# Patient Record
Sex: Male | Born: 1985 | Race: White | Hispanic: No | Marital: Single | Smoking: Never smoker
Health system: Southern US, Community
[De-identification: ages and names within clinical notes are randomized; demographics above are authoritative.]

## PROBLEM LIST (undated history)

## (undated) ENCOUNTER — Emergency Department (HOSPITAL_COMMUNITY): Payer: 59 | Source: Home / Self Care

## (undated) DIAGNOSIS — IMO0001 Reserved for inherently not codable concepts without codable children: Secondary | ICD-10-CM

## (undated) DIAGNOSIS — Z5189 Encounter for other specified aftercare: Secondary | ICD-10-CM

## (undated) DIAGNOSIS — Z8774 Personal history of (corrected) congenital malformations of heart and circulatory system: Secondary | ICD-10-CM

## (undated) DIAGNOSIS — R011 Cardiac murmur, unspecified: Secondary | ICD-10-CM

## (undated) DIAGNOSIS — Q2112 Patent foramen ovale: Secondary | ICD-10-CM

## (undated) DIAGNOSIS — K219 Gastro-esophageal reflux disease without esophagitis: Secondary | ICD-10-CM

## (undated) DIAGNOSIS — Z8489 Family history of other specified conditions: Secondary | ICD-10-CM

## (undated) DIAGNOSIS — J069 Acute upper respiratory infection, unspecified: Secondary | ICD-10-CM

## (undated) DIAGNOSIS — Q8719 Other congenital malformation syndromes predominantly associated with short stature: Secondary | ICD-10-CM

## (undated) DIAGNOSIS — Q211 Atrial septal defect: Secondary | ICD-10-CM

## (undated) HISTORY — PX: APPENDECTOMY: SHX54

## (undated) HISTORY — PX: TETRALOGY OF FALLOT REPAIR: SHX796

## (undated) HISTORY — PX: GASTROSTOMY TUBE PLACEMENT: SHX655

## (undated) HISTORY — PX: MECKEL DIVERTICULUM EXCISION: SHX314

## (undated) HISTORY — PX: JOINT REPLACEMENT: SHX530

## (undated) HISTORY — PX: HERNIA REPAIR: SHX51

## (undated) HISTORY — DX: Patent foramen ovale: Q21.12

## (undated) HISTORY — PX: OTHER SURGICAL HISTORY: SHX169

## (undated) HISTORY — PX: EYE SURGERY: SHX253

## (undated) HISTORY — DX: Atrial septal defect: Q21.1

---
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
## 2007-08-22 ENCOUNTER — Ambulatory Visit: Payer: Self-pay | Admitting: General Surgery

## 2008-11-21 ENCOUNTER — Ambulatory Visit (HOSPITAL_COMMUNITY): Admission: RE | Admit: 2008-11-21 | Discharge: 2008-11-21 | Payer: Self-pay | Admitting: Ophthalmology

## 2008-11-27 ENCOUNTER — Encounter (INDEPENDENT_AMBULATORY_CARE_PROVIDER_SITE_OTHER): Payer: Self-pay | Admitting: Ophthalmology

## 2008-11-27 ENCOUNTER — Ambulatory Visit: Payer: Self-pay | Admitting: Pediatrics

## 2008-11-27 ENCOUNTER — Inpatient Hospital Stay (HOSPITAL_COMMUNITY): Admission: RE | Admit: 2008-11-27 | Discharge: 2008-11-29 | Payer: Self-pay | Admitting: Ophthalmology

## 2009-02-10 ENCOUNTER — Ambulatory Visit (HOSPITAL_COMMUNITY): Admission: RE | Admit: 2009-02-10 | Discharge: 2009-02-10 | Payer: Self-pay | Admitting: Ophthalmology

## 2009-02-10 ENCOUNTER — Encounter (INDEPENDENT_AMBULATORY_CARE_PROVIDER_SITE_OTHER): Payer: Self-pay | Admitting: Ophthalmology

## 2009-04-14 ENCOUNTER — Ambulatory Visit: Payer: Self-pay | Admitting: General Surgery

## 2009-05-05 ENCOUNTER — Ambulatory Visit (HOSPITAL_COMMUNITY): Admission: RE | Admit: 2009-05-05 | Discharge: 2009-05-05 | Payer: Self-pay | Admitting: Urology

## 2009-09-12 ENCOUNTER — Ambulatory Visit (HOSPITAL_COMMUNITY): Admission: RE | Admit: 2009-09-12 | Discharge: 2009-09-12 | Payer: Self-pay | Admitting: Pediatric Dentistry

## 2010-02-09 ENCOUNTER — Ambulatory Visit: Payer: Self-pay | Admitting: General Surgery

## 2010-06-26 ENCOUNTER — Ambulatory Visit
Admission: RE | Admit: 2010-06-26 | Discharge: 2010-06-26 | Disposition: A | Discharge status: Home / Self Care | Payer: 59 | Source: Ambulatory Visit | Attending: Pediatrics | Admitting: Pediatrics

## 2010-06-26 ENCOUNTER — Other Ambulatory Visit: Payer: Self-pay | Admitting: Pediatrics

## 2010-06-26 DIAGNOSIS — R05 Cough: Secondary | ICD-10-CM

## 2010-06-29 LAB — POCT I-STAT 4, (NA,K, GLUC, HGB,HCT)
HCT: 42 % (ref 39.0–52.0)
Potassium: 4.2 mEq/L (ref 3.5–5.1)
Sodium: 139 mEq/L (ref 135–145)

## 2010-07-18 LAB — POCT I-STAT 4, (NA,K, GLUC, HGB,HCT)
Glucose, Bld: 116 mg/dL — ABNORMAL HIGH (ref 70–99)
HCT: 39 % (ref 39.0–52.0)
Potassium: 3.9 mEq/L (ref 3.5–5.1)

## 2010-08-25 NOTE — Op Note (Signed)
NAMEJAMARIS, THEARD                ACCOUNT NO.:  [id number]   MEDICAL RECORD NO.:  [id number]          PATIENT TYPE:  AMB   LOCATION:  SDS                          FACILITY:  [facility]   PHYSICIAN:  Pasty Spillers. Maple Hudson, M.D. DATE OF BIRTH:  March 24, 1986   DATE OF PROCEDURE:  11/04/2006  DATE OF DISCHARGE:                               OPERATIVE REPORT   PREOPERATIVE DIAGNOSES:  1. Cornelia de Lange syndrome.  2. Myopia.  3. 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   POSTOPERATIVE DIAGNOSES:  1. Cornelia de Lange syndrome.  2. Myopia.  3. 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   PROCEDURE:  Examination under anesthesia.   SURGEON:  Pasty Spillers. Maple Hudson, M.D.   ANESTHESIA:  General endotracheal.   DESCRIPTION OF PROCEDURE:  After preop evaluation and preoperative  pupillary dilation and informed consent, the patient was taken up the  room where he was identified by me.  General anesthesia was induced  without difficulty after placement of appropriate monitors.  As soon as  the patient was intubated, intraocular pressures were measured with a  Tono-Pen.  Intraocular pressures were 10 mmHg plus or minus 5% in the  right eye, and 11 mmHg plus or minus 5% in the left.  There was a large  white cataract in the right eye.  There is no fundus view in the right  eye.  The left cornea was found to have a large fibrovascular pannus  extending over almost the entire cornea, with only 1-2 mm crescent at  the temporal edge of the cornea unaffected.  This pannus prevented the  useful retinoscopic streak.  A good fundus view was, however, still  possible, and the fundus was flat 360 degrees with scleral depression.  The patient remained under anesthesia for dental work, which was  performed and is dictated separately by Tor Netters, D.D.S.      Pasty Spillers. Maple Hudson, M.D.  Electronically Signed     WOY/MEDQ  D:   11/04/2006  T:  11/05/2006  Job:  [id number]

## 2010-08-25 NOTE — Discharge Summary (Signed)
Justin Watson, Justin Watson                ACCOUNT NO.:  [id number]   MEDICAL RECORD NO.:  [id number]          PATIENT TYPE:  INP   LOCATION:  6114                         FACILITY:  [facility]   PHYSICIAN:  Dyann Ruddle, MDDATE OF BIRTH:  1985/12/15   DATE OF ADMISSION:  11/27/2008  DATE OF DISCHARGE:  11/29/2008                               DISCHARGE SUMMARY   REASON FOR HOSPITALIZATION:  Pain management, status post left eye  central reattachment surgery.   FINAL DIAGNOSIS:  Left eye retinal reattachment surgery, vitrectomies,  scleral buckle procedure.  Post-operative pain  Cornelia de Lange syndrome  Global developmental delay  Growth failure   BRIEF HOSPITAL COURSE:  Justin Watson is a 25 year old male with complicated  past medical history including cornelia de lange syndrome admitted for  pain control, status post left eye retinal reattachment surgery that was  performed on November 27, 2008, by Dr. Luciana Axe of Ophthalmology.  Initially, Justin Watson had a difficult postoperative course x24 hours with  significant agitation and pain.  He required IV morphine, Klonopin,  Ativan, and IV Toradol.  Over the following 24-48 hours, his pain and  agitation significantly decreased and he was transitioned p.o. via his G  tube medication and was discharged home in the care of his family.  He  was increasing his p.o. intake on discharge in the way of his formula  and Pedialyte.  He also had had a several wet diapers before his  discharge.  He has not had a bowel movement, but per the family, they  would prefer to get him an enema when he was home.   DISCHARGE WEIGHT:  15.8 kg.   DISCHARGE CONDITION:  Improved.   DISCHARGE DIET:  Resume regular diet.   DISCHARGE ACTIVITY:  Ad lib.   PROCEDURE:  Left eye retinal reattachment surgery, vitrectomy, scleral  buckle.   CONSULTANTS:  Ophthalmology Dr. Luciana Axe.   HOME MEDICATIONS:  1. Lactulose 1.3 g p.o. per G tube b.i.d.  2. Nexium 20 mg p.o. b.i.d.  3. Ibuprofen 160 mg p.o. per G tube t.i.d.  4. Zyrtec 5 mg p.o. per G tube 1 in the a.m.  5. MiraLax 1 capsule p.o. per G tube per day p.r.n. constipation.   NEW MEDICATIONS:  1. Klonopin 0.5 mg every 8 hours p.r.n. agitation.  2. Ibuprofen 160 mg q.6 p.r.n. pain.  3. Roxicodone 1.5 mg q.4-6 h. p.r.n. pain.  4. TobraDex ointment apply to the left eye 3 times a day.  5. Atropine ointment apply to the left eye b.i.d.  allow 15 minutes      between ointments.   PENDING RESULTS:  None.   FOLLOWUP ISSUES:  1. If agitation improves, return to Klonopin once daily at bedtime.  2. Keep soft patch and hard patch to left eye to protect.   FOLLOWUP APPOINTMENT:  With Dr. Luciana Axe Ophthalmology on December 04, 2008,  at 9:45 p.m., primary MD is Dr. Avis Epley at [facility].  We will fax the  discharge summary to her, no appointment is needed at this time.      Alvia Grove, DO  Electronically  Signed      Dyann Ruddle, MD  Electronically Signed    BB/MEDQ  D:  11/29/2008  T:  11/30/2008  Job:  [id number]

## 2010-08-25 NOTE — Op Note (Signed)
NAMESTAN, CANTAVE                ACCOUNT NO.:  [id number]   MEDICAL RECORD NO.:  [id number]          PATIENT TYPE:  AMB   LOCATION:  SDS                          FACILITY:  [facility]   PHYSICIAN:  Pasty Spillers. Maple Hudson, M.D. DATE OF BIRTH:  [birth year]   DATE OF PROCEDURE:  11/21/2008  DATE OF DISCHARGE:                               OPERATIVE REPORT   PREOPERATIVE DIAGNOSES:  1. High myopia.  2. History of retinal detachment, right eye.  3. Cornelia de Lange syndrome.   POSTOPERATIVE DIAGNOSIS:  1. Retinal detachment, left eye.  2. High myopia.  3. History of retinal detachment, right eye.  4. Cornelia de Lange syndrome.   PROCEDURE:  Examination under anesthesia, both eyes.   SURGEON:  Pasty Spillers. Young, MD   ANESTHESIA:  General (laryngeal mask).   COMPLICATIONS:  None.   DESCRIPTION OF PROCEDURE:  After preop evaluation including informed  consent from the parents, the patient was taken to the operating room  where he was identified by me.  General anesthesia was induced without  difficulty after placement of appropriate monitors.  Note that the  patient has had been dilated pharmacologically in the preop area.   The right eye was inspected.  The cornea was clear.  The lid margin  showed minimal blepharitis.  The lens was opaque.  Several  (approximately 10) small (0.1 mm or less) white, refractile objects were  noted in the anterior chamber, on the anterior surface of the iris  inferiorly.  The cornea was clear.  There was no fundus view.  The left  eye was then inspected.  Lid margin showed mild posterior blepharitis.  The cornea was scarred, with a longitudinal scar running roughly  horizontally, approximately 6 mm horizontally versus 2 mm vertically,  with its inferior border impinging on the line of sight.  There was a  less dense opacity extending from the center of this opacity into the  visual axis.  The conjunctiva was quiet.  The pupil was reasonably well  dilated.   Intraocular pressures by Tono-Pen were 14+ -5 right eye and 5+  -5 left eye.  There was no useful retinoscopic streak in the left eye.  There was no streak in the right.  Dilated fundus exam was carried out  with an indirect ophthalmoscope with a 25 diopter lens.  The disc was  visible, but the view was somewhat hazy.  The retina appeared detached  inferiorly, and the detachment  appeared to involve macula.  There was a red reflex superiorly, but  fundus detail was not visible.  No distinct break was noted.  The  patient was awakened without difficulty and taken to the recovery room  in stable condition, having suffered no intraoperative or immediate  postoperative complications.      Pasty Spillers. Maple Hudson, M.D.  Electronically Signed     WOY/MEDQ  D:  11/21/2008  T:  11/21/2008  Job:  [id number]

## 2010-08-25 NOTE — Op Note (Signed)
NAMEPRABHAV, FAULKENBERRY                ACCOUNT NO.:  [id number]   MEDICAL RECORD NO.:  [id number]          PATIENT TYPE:  AMB   LOCATION:  SDS                          FACILITY:  [facility]   PHYSICIAN:  Tor Netters, D.D.S.DATE OF BIRTH:  Aug 04, 1985   DATE OF PROCEDURE:  DATE OF DISCHARGE:                               OPERATIVE REPORT   PREOPERATIVE DIAGNOSIS:  Extensive dental caries and medical and  physical handicaps.   POSTOPERATIVE DIAGNOSIS:  Extensive dental caries and medical and  physical handicaps.   TREATMENT RENDERED:  Dental restoration, x-rays, and a cleaning.   OPERATIVE NOTE:  Joandy was taken to the OR and placed in a supine  position.  Dr. Verne Carrow, pediatric ophthalmologist, did his  procedure first.  General anesthesia was administered and a nasotracheal  tube was used.  When we came in, our team took 6 intraoral radiographs.  Ericson's mouth was packed with a throat pack and his mouth was opened  with a dental mouth prop.  An occlusal amalgam restoration was placed on  tooth #19, the lower left 1st premanent molar.  A facial composite  restoration was placed on primary cuspid tooth M.  Upon completion of  this dentistry, Dewarren's teeth were cleaned with a dental pumice tooth  placed a topical fluoride application was applied.  His mouth prop and  throat pack were then removed and he was taken to recovery room without  incidence.      Tor Netters, D.D.S.  Electronically Signed     MEG/MEDQ  D:  11/04/2006  T:  11/04/2006  Job:  [id number]

## 2010-08-25 NOTE — Op Note (Signed)
Justin Watson, Justin Watson                ACCOUNT NO.:  [id number]   MEDICAL RECORD NO.:  [id number]          PATIENT TYPE:  INP   LOCATION:  6114                         FACILITY:  [facility]   PHYSICIAN:  Alford Highland. Rankin, M.D.   DATE OF BIRTH:  [birth year]   DATE OF PROCEDURE:  11/28/2008  DATE OF DISCHARGE:                               OPERATIVE REPORT   PREOPERATIVE DIAGNOSES:  1. Left eye.  A total retinal detachment, left eye with funnel      detachment as found on examination under anesthesia by Dr. Verne Carrow 10 days prior.  2. History corneal opacity, left eye, secondary to chronic      blepharitis.  3. Chronic blepharitis.  4. 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  5. Rosacea blepharitis, chronic.   POSTOPERATIVE DIAGNOSES:  1. Left eye.  A total retinal detachment, left eye with fundal      detachment as found on examination under anesthesia by Dr. Verne Carrow 10 days prior.  2. History corneal opacity, left eye, secondary to chronic      blepharitis.  3. Chronic blepharitis.  4. 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  5. Rosacea blepharitis, chronic.  6. 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  7. Floppy eyelid syndrome, left eye.  8. Giant retinal tear.  9. 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   PROCEDURES:  1. Complex retinal detachment repair of the left eye - vitrectomy - 25      gauge, endolaser retinopexy; membrane peel; lensectomy, left eye;      scleral buckle; retinal cryopexy 360 degrees, injection of      temporary vitreous substitute - Perfluoron, air-fluid exchange,      retinotomy and retinectomy radially, inferonasal quadrant of left      eye;  injection of permanent vitreous substitute - silicone oil 5000      centistokes  2. Superficial keratectomy in a do-nut shape fashion 360 degrees      corneum, left eye with central 2 mm area left intact except for      corneal epithelial scraping.  3. Gunderson flap - conjunctival flap - conjunctivoplasty total flap      over the corneum.  4. Lateral tarsorrhaphy proximally 4 mm in length.   SURGEON:  Alford Highland. Rankin, MD   ANESTHESIA:  General endotracheal anesthesia.   INDICATIONS FOR PROCEDURE:  The patient is a 25 year old poorly socially  functional young person with the above-mentioned Cornelia de Lange  syndrome who has lived by some 17-18 years as life expectancy who has  monocular status in the recent past with a functioning left eye and  suffers from self-immolation and accidental injuries about the head  and  neck with blunt trauma from his hands and facial, head, and body  movements.  The patient is not institutionalized and is extremely well  taken care by his family accounting for his survivability.   The patient was noted to have some change in behavior and visual manner  some 2 months or more previous to this time.  Based upon this, the  patient was having examination under anesthesia where a retinal  detachment was discovered.  The patient is seen and referred to me for  surgical intervention.  After lengthy discussions with the family, they  understood that there was a low probability of long-term visual acuity  improvement, roughly 60% chance of retinal reattachment.  On maintaining  reattached state, given his underlying condition, his compliance issues  as well as the extreme difficulty that will be had with compliance of  medical therapy postoperatively.  Nonetheless, we all agree that there  was worth one chance of surgical intervention to try to reattach the  retina to afford him some ambulatory vision capability.  The patient and  the family of course  understood that he lives with hole in the eye after  surgical repair.  If it could be surgical repair that this will also  provide for some stability of the globe.  They understand the risk of  surgical intervention including loss of the eye, infection, need for  another surgery, no change in vision, loss of vision, discomfort as well  as postoperative issues including pain.  He understood these risks and  wishes to proceed.  The patient was consented for a surgical  intervention to include vitrectomy, scleral buckle, and retinal  cryopexy.  At the time of surgical intervention, the patient's and the  family understood these risks and wish to proceed.   DESCRIPTION OF PROCEDURE:  Appropriate signed consent was obtained, the  patient was taken to the operating room.  The left eye had been marked  previously and preoperatively, and again agreed upon.  General  endotracheal anesthesia administered without difficulty.  Left  periocular region was sterilely prepped and draped in the usual  ophthalmic fashion.  Lid speculum was applied.  Immediately at this  time, examination under anesthesia was done with indirect  ophthalmoscope, which confirmed total retinal detachment and open funnel  detachment, but I could see inferonasally.  A 3-1/2 o'clock hour retinal  tear near the ora serrata, giant retinal tear.  The difficulty hour  versus central corneal opacification was broader, more extensive than I  believed preoperatively.  At this portion of the procedure, a decision  was made to commence with scleral buckle.  Conjunctival peritomy was  fashioned 360 degrees.  Significant redundant conjunctiva was noted.  The quadrants were entered and each of the rectus muscles were isolated  on 2-0 silk ties.  Retinal cryopexy in open fashion was then carried  out.  Two rows 360 degrees to provide for a second ora type of serrata  and to help close both holes, it might not be visualized  intraoperatively.   At this time, a 25-gauge trocar was placed  inferotemporal quadrant.  Superior trocars applied.  Notable findings  are very difficult to see posteriorly with difficulty, particularly  superotemporally and superiorly.  This was all from the peripheral and  central dense scarring.  The peripheral of dense scarring of the cornea,  I should clarify.  It was at this time that core vitrectomy was  completed, lensectomy was carried out using vitrectomy  rotation and is  quite satisfied that all lens material and capsular material was  removed.  Nonetheless, even this portion of procedure was very difficult  to the peripheral and central corneal scarring.  At this time, that I  elected to use the 0.01 Grieshaber blade and attempt to remove some of  the anterior bones, membrane, scarring, knowing from the etiology of his  chronic blepharitis that this was typically a Bowman membrane layer  scarring.  For this reason, a peripheral section of corneal  opacification was selected, and using the 0.01 Grieshaber blade in a  scraping fashioned, epithelium was removed in this area and then an  attempt was made to identify a plane of dissection for the white stromal  scarring.  Indeed the white stromal scarring could be identified and  could be elevated in a plane with underlying clear cornea remaining.  In  this fashion, a superficial limiting keratectomy was then carried out  roughly in a doughnut fashion around the central cornea.  The epithelium  was stripped off the corneum completely.  Thereafter, the central  cornea, which appeared to be thinner cornea, but also appeared to be  denser white fibrous area.  No attempt was made to perform a superficial  keratectomy in appraise of the central 3-mm diameter of central cornea.  Nonetheless, the doughnut-shaped type superficial keratectomy was then  carried out nearly 360, and no areas of abnormal thinning were noted on  the cornea.  Nonetheless, this type  of superficial keratectomy, which  was lengthy in procedure was quite difficult because of the anterior  stromal ingrowth vasculature superiorly and inferiorly.  Laser  photocoagulation at the limbus externally was then carried out to try to  prevent this re-ingrowth of this fibrous tissue from vascular tissue.  Nonetheless, superficial keratectomy was then carried out in this  fashion.  This allowed for excellent visualization posteriorly to  alleviate the irregular surface anomalys. For biome visualization, a  full-thickness Goniosol was then placed over the cornea and this allowed  excellent visualization posteriorly and completion of the vitrectomy  portion procedure could done without difficulty.  At this time,  attention was taken to perform some vitrectomy.  The vitrectomy was then  completed.  Scleral compression was then used to trim the vitreous base.  Perfluoron was placed in the depth of the funnel over the posterior pole  to stabilize the posterior pole and peripheral vitreous secured, was  trimmed further to 360 degrees.  Excellent visualization was obtained.  It must be noted that during the lensectomy the few surgical  pupilloplasty was then carried out also to enlarge the pupil.  A  peripheral iridectomy at 6 o'clock position was then carried out as well  to allow for leaving the silicone oil at the end of the case.  No  complications from this procedure were noted.  At this time, fluid  exchange was then completed.  With a half of fluid air exchange, it was  quite clear that the inferonasal retina was taut circumferentially and  thus a radial incision retinotomy was then carried out in this area.  Partial retinectomy with anterior leaflets removed as well as  posteriorly to complete the radial retinotomy, it was carried out  inferonasally and this allowed for the retina to flatten rather nicely.  Retinotomy was just at the equator inferonasally at the 7:30 - 8  meridian.   At this time, the retina was flattened nicely.  Fluid  exchange was carried out  completely of the Perfluoron.  The Endolaser  photocoagulation was carried out peripherally as well as posteriorly as  well as around the posterior pole, and around the posterior arcades.  No  treatment was necessary in the macula.  Nasal to the optic nerve,  excellent flattening was maintained.  The retina flattened rather  nicely.  At this time, after reaccumulated fluid have been removed on  numerous occasions, the Perfluoron was removed and notably there is no  retinal retraction and the retina remained nicely attached.   At this time, an air - silicone oil exchange carried out 20-gauge, MVR  blade created superonasally in the site of the superonasal trocar had  been removed.  This allowed for placement of the silicone oil passively  under direct visualization.  Overall, we maintained the retinal  reattachment nicely.  Superotemporal trocar was removed and the site was  closed with 7-0 Vicryl.  At this time, the superonasal sclerotomy closed  with 7-0 Vicryl.  Appropriate oil fill was obtained.  The infusion site  was then removed and closed with 7-0 Vicryl.  At this time, we were left  with a significant superficial keratectomy, which has remained painful  and adding to the disorientation to this patient postoperatively.  I  discussed via the phone with Dr. Verne Carrow, the prospects of using  contact lens and lateral tarsorrhaphy.  He agreed with lateral  tarsorrhaphy, although the lens would probably not last long.  At this  time, as I started to close the conjunctiva inferiorly that it was quite  noticeable that there was significant redundant conjunctiva particularly  superiorly with easy mobility.  It is at this time that I chose to elect  to use Gunderson conjunctival flap.  This alleviated many postoperative  problem as well as promote healing.  His superficial keratectomy without  a Gunderson  flap was going to promote reformation and perhaps exuberant  formation of scarring worse than prior than what he had before.  This is  the less reason that I elected to perform the Gunderson flap.  Tenonectomy was then carried out superiorly and extensively to allow for  excellent mobilization, but also to thin the flap out.  Rather easily  the flap was brought down over the cornea from the superior base flap,  brought down over the cornea and was secured to the sclera superiorly  with 7-0 Vicryl suture at the limbus superiorly with clear corneal  passes at the limbus superiorly with 8-0 Vicryl and also 360 degrees 8-0  Vicryl.  Inferiorly, the conjunctiva had been reapproximated to the  limbus with 7-0 Vicryl.  Thereafter, the conjunctiva was then attached  to the superior flap, also 7-0 Vicryl suture to approximate the edges.   There was no tension on the superior flap at all.  At this time, the all  conjunctival edges were apposed with either combination of 8-0 and 7-0  Vicryl.   At this time, limited lateral tarsorrhaphy was elected to try to  minimize the trauma to the [facility] flap as well as to the eye.   It was also felt that the Gunderson flap would allow for improved  patient comfort because of the severe extent of the keratectomy that the  Gunderson flap would provide in ocular surface that would minimize this  patient's postoperative discomfort as well as potentially allow for  conjunctival thinning and clarity as the cornea cleared the rest of the  as time went by, the cornea might clear the  flap and it might provide  sufficient visualization.  Also, the Gunderson flap can be partially  reversed in the future.   Limited lateral tarsorrhaphy was begun.  The posterior lid margin  surface was then shaved with 0.1 Grieshaber blade for approximate  lateral 3-1/2 to 4 mm, both inferior and superiorly.  At this time, a 6-  0 chromic, which had been washed free of alcohol was  then passed through  foam pledgets and lid bolsters, both upper and lower and a trans lid  vertical mattress suture was then placed with good apposition of the  exposed edges of the lid.  This was tied to the appropriate tension.  Good excellent approximation of the floppy lid syndrome was thus  created.  This would help minimize trauma to the flap as well as to the  eye.  At this time, the left face was cleansed.  Sterile patch Fox shield  applied.  The patient tolerated the procedure without complication.  He  was taken to the PACU in good and stable condition.      Alford Highland Rankin, M.D.  Electronically Signed     GAR/MEDQ  D:  11/28/2008  T:  11/29/2008  Job:  [id number]   cc:   Pasty Spillers. Maple Hudson, M.D.  Theador Hawthorne, M.D.

## 2010-08-28 NOTE — Op Note (Signed)
Justin Watson, Justin Watson                ACCOUNT NO.:  [id number]   MEDICAL RECORD NO.:  [id number]          PATIENT TYPE:  AMB   LOCATION:  ENDO                         FACILITY:  [facility]   PHYSICIAN:  Prabhakar D. Pendse, M.D.DATE OF BIRTH:  1985/09/27   DATE OF PROCEDURE:  09/07/2005  DATE OF DISCHARGE:                                 OPERATIVE REPORT   PREOPERATIVE DIAGNOSES:  1.  Nonfunctioning gastrostomy button.  2.  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   POSTOPERATIVE DIAGNOSES:  1.  Nonfunctioning gastrostomy button.  2.  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   OPERATION PERFORMED:  1.  Removal of old nonfunctioning gastrostomy button.  2.  Placement of new Bard #18 French, 1.7 cm stem button.   SURGEON:  Prabhakar D. Levie Heritage, M.D.   ASSISTANT:  Nurse.   ANESTHESIA:  Topically EMLA cream and Versed via gastrostomy.   OPERATIVE PROCEDURE:  Under satisfactory sedation with Versed, gastrostomy  site was cleansed and prepped.  The previously-placed nonfunctioning  gastrostomy button was removed by manipulation.  The tract was lubricated.  A new #18 Bard button with 1.7 cm stem was placed in with manipulation.  The  button appeared to go in without any difficult.  The button was irrigated  with 50 mL of saline.  There was no leakage noted.  The area was cleansed,  appropriate dressing applied.  Instructions were given to the caretakers  about the care of the button and the patient was discharged to be followed  as an outpatient.           ______________________________  Hyman Bible. Levie Heritage, M.D.     PDP/MEDQ  D:  09/07/2005  T:  09/07/2005  Job:  [id number]

## 2010-08-28 NOTE — Discharge Summary (Signed)
[name], [name] NO.:  [id number]   MEDICAL RECORD NO.:  [id number]          PATIENT TYPE:  INP   LOCATION:  6114                         FACILITY:  [facility]   PHYSICIAN:  Asher Muir, M.D.         DATE OF BIRTH:  10-17-1985   DATE OF ADMISSION:  08/31/2004  DATE OF DISCHARGE:  09/04/2004                                 DISCHARGE SUMMARY   HOSPITAL COURSE:  Justin Watson is a 25 year old white male with Cornelia de Lange's  syndrome that was admitted for bilateral Staph aureus hand cellulitis  secondary to biting.  When admitted he was begun on IV vancomycin 50 mg/kg  q.8h. x4 days.  Sensitivities were obtained from a blister on the hand and  was shown to be Staph aureus sensitive to tetracycline so doxycycline was  begun on May 25.  He tolerated this well per G tube prior to discharge.  During his admission we did obtain a GI consult to evaluate for underlying  GI pain which might be causing his self mutilation.  Dr. Chestine Spore suggested  adding bethanechol 1.6 mg to his current GERD control.  We also changed him  from lactulose to MiraLax.  A right upper quadrant ultrasound was obtained  to rule out gallstones which was read as normal.  We also consulted  occupational therapy to see if they could help with prevention of his hand  biting.  The hands were otherwise wrapped throughout this admission and were  given intermittent dressing changes with warm compresses.   OPERATION/PROCEDURE:  1.  Sep 02, 2004:  Right upper quadrant ultrasound which was read within      normal limits and no gallstones noted.  2.  Intravenous antibiotics May 22 to May 25.  3.  Gastrointestinal consult.  4.  Occupational therapy consult.   DIAGNOSES:  1.  Staph aureus cellulitis of the hands bilaterally.  2.  Cornelia de Lange's syndrome.   DISCHARGE MEDICATIONS:  1.  Nexium 20 mg p.o. b.i.d.  2.  Lactulose 2.3 teaspoons p.o. daily or MiraLax 17 g daily.  3.  Klonopin 500 mg p.o. q.h.s.  4.   Bethanechol 1.6 mg p.o. t.i.d.  5.  Doxycycline 65 mg p.o. daily x10 days.  6.  Zyrtec 5 mg p.o. daily.   DISCHARGE WEIGHT:  16.7 kg.   CONDITION ON DISCHARGE:  Improved.   DISCHARGE INSTRUCTIONS:  1.  Follow up with Dr. Avis Epley early next week.  Mom to call and make this      appointment as she has very good      repoire with this primary care physician.  2.  Outpatient OT for follow-up.  3.  To contact Dr. Avis Epley if any clinical concerns.      MZ/MEDQ  D:  09/04/2004  T:  09/04/2004  Job:  [id number]

## 2010-08-28 NOTE — Op Note (Signed)
NAMEMARQUESE, Justin Watson                ACCOUNT NO.:  [id number]   MEDICAL RECORD NO.:  [id number]          PATIENT TYPE:  AMB   LOCATION:  ENDO                         FACILITY:  [facility]   PHYSICIAN:  Prabhakar D. Pendse, M.D.DATE OF BIRTH:  1986-01-16   DATE OF PROCEDURE:  07/02/2004  DATE OF DISCHARGE:                                 OPERATIVE REPORT   PREOPERATIVE DIAGNOSES:  1.  Nonfunctioning gastrostomy button.  2.  Status post multiple surgical procedures for Cornelia de Lange syndrome.   POSTOPERATIVE DIAGNOSES:  1.  Nonfunctioning gastrostomy button.  2.  Status post multiple surgical procedures for Cornelia de Lange syndrome.   PROCEDURE PERFORMED:  1.  Removal of nonfunctioning gastrostomy button.  2.  Placement of new bard #18, 1.7-cm button.   SURGEON:  Prabhakar D. Levie Heritage, M.D.   ASSISTANT:  Nurse.   ANESTHESIA:  Topical Emla cream and Versed 2 mg sedation.   OPERATIVE PROCEDURE:  The patient apparently got only half the dose of  intended Versed, however, slightly sedated.  The gastrostomy site was  cleansed and the previously placed nonfunctioning gastrostomy button was  removed by manipulation.  A new #18 [place], 1.7-cm stem bard button was  placed with manipulation.  The button was irrigated with 50 cc of saline.  No leakage was noted.  The area was cleansed, the appropriate dressing  applied and instructions were given to the parents regarding care of the  button.  The patient was discharged to be followed as an outpatient.      PDP/MEDQ  D:  07/02/2004  T:  07/02/2004  Job:  [id number]

## 2010-08-28 NOTE — Op Note (Signed)
[facility]  Patient:    Justin Watson, Justin Watson                       MRN: [id number] Proc. Date: 09/22/00 Adm. Date:  [id number] Attending:  [place] Pho Damodar                           Operative Report  PREOPERATIVE DIAGNOSIS: 1. Nonfunctioning gastrostomy button. 2. Cornelia Delange syndrome with multiple congenital anomalies. 3. Status post multiple surgical procedures.  POSTOPERATIVE DIAGNOSIS: 1. Nonfunctioning gastrostomy button. 2. Cornelia Delange syndrome with multiple congenital anomalies. 3. Status post multiple surgical procedures.  OPERATION PERFORMED: 1. Removal of old Bard #18, 1.7 cm button placed on ____________ 2. Placement of new Bard #18, 1.7 cm button.  SURGEON:  Prabhakar D. Levie Heritage, M.D.  ASSISTANT:  Nurse.  ANESTHESIA:  Topical EMLA cream and chloral hydrate sedation.  DESCRIPTION OF PROCEDURE:  Under satisfactory sedation and topical EMLA cream anesthesia, gastrostomy site was prepped and the previously placed gastrostomy button was removed.  The area was cleansed.  A new #18, 1.7 cm button was placed with manipulation.  The button was irrigated with 50 cc of saline. There was no leakage noted.  Appropriate instructions were given to the mother regarding the care of the button and the patient was discharged to be followed as an outpatient. DD:  09/22/00 TD:  09/22/00 Job: [id number]

## 2010-08-28 NOTE — Op Note (Signed)
   NAME:  Justin Watson, Justin Watson                          ACCOUNT NO.:  [id number]   MEDICAL RECORD NO.:  [id number]                   PATIENT TYPE:  OIB   LOCATION:  2899                                 FACILITY:  [facility]   PHYSICIAN:  Tor Netters, D.D.S.          DATE OF BIRTH:  09-17-1985   DATE OF PROCEDURE:  12/01/2001  DATE OF DISCHARGE:  12/01/2001                                 OPERATIVE REPORT   This procedure was done in conjunction with Dr. Verne Carrow, pediatric  ophthalmologist.   PREOPERATIVE DIAGNOSES:  Mentally handicapped and inability to check the  child's teeth in the office, so we are doing an oral examination and any  necessary treatment.   POSTOPERATIVE DIAGNOSES:  1. Mentally handicapped child.  2. Dental restoration, cleaning and x-rays.   PROCEDURE PERFORMED:  Dental restoration, cleaning and x-rays.   DESCRIPTION OF PROCEDURE:  We started off by taking four intraoral  radiographs.  The patient's mouth was then opened with a dental mouth prop.  A rubber dam could not be used except for one posterior restoration.  On the  mandibular right first permanent molar an occlusal amalgam restoration was  placed using acid etch and bonding.  On the maxillary left and right the  first permanent molars an occlusal lingual amalgam restoration was placed  using acid etch and bonding.  On completion of that dentistry, the patient's  teeth were cleaned with a dental pumice toothpaste.  His mouth prop and  throat pack were removed.  He was then turned over to Dr. Maple Hudson for his  ophthalmology procedure.                                               Tor Netters, D.D.S.    MEG/MEDQ  D:  12/01/2001  T:  12/04/2001  Job:  [id number]

## 2010-08-28 NOTE — Op Note (Signed)
   NAME:  CHINEDUM, VANHOUTEN                          ACCOUNT NO.:  [id number]   MEDICAL RECORD NO.:  [id number]                   PATIENT TYPE:  OIB   LOCATION:  2899                                 FACILITY:  [facility]   PHYSICIAN:  Pasty Spillers. Maple Hudson, M.D.              DATE OF BIRTH:  09/16/1985   DATE OF PROCEDURE:  12/01/2001  DATE OF DISCHARGE:  12/01/2001                                 OPERATIVE REPORT   PREOPERATIVE DIAGNOSES:  1. High myopia.  2. Retinal detachment, right eye.  3. Cornelia de Lange syndrome.   POSTOPERATIVE DIAGNOSES:  1. High myopia.  2. Retinal detachment, right eye.  3. Cornelia de Lange syndrome.   OPERATION PERFORMED:  Examination under anesthesia, both eyes.   SURGEON:  Pasty Spillers. Maple Hudson, M.D.   ANESTHESIA:  General (mask inhalation).   COMPLICATIONS:  None.   DESCRIPTION OF PROCEDURE:  After routine preoperative evaluation, the  patient was brought to the operating room where he was identified by me.  General anesthesia was induced without difficulty after placement of  appropriate monitors.  Dental examination and teeth cleaning were carried  out by Dr. Quincy Simmonds, and this is dictated separately.  This was done  while waiting for dilating drops to dilate the pupils.   The right eye was examined and it was found to be stable with an opaque  white cataract due to previous retinal detachment.  The cornea of the left  eye exhibited some mild scarring inferiorly and a small round grey-white  opacity near the center of the cornea, measuring less then 1 mm in diameter.  Conjunctiva was quiet.  The anterior chamber was otherwise unremarkable.  The lens appeared clear.  The fundus was myopic, but fully attached,  examined with scleral depression 360 degrees.  Retinoscopy was difficult  with very indistinct endpoint.  My estimate is approximately -20, but again  the endpoint was quite indistinct.  Intraocular pressure by Tonopen was 19  plus or minus  10% in the right eye and 21 plus or minus 10% in the left eye.   The patient was awakened without difficulty and taken to the recovery room  in stable condition having suffered no intraoperative or immediate  postoperative complications.                                               Pasty Spillers. Maple Hudson, M.D.    Cheron Schaumann  D:  12/06/2001  T:  12/09/2001  Job:  [id number]

## 2010-08-28 NOTE — Op Note (Signed)
NAMEALLIN, FRIX                ACCOUNT NO.:  [id number]   MEDICAL RECORD NO.:  [id number]          PATIENT TYPE:  AMB   LOCATION:  ENDO                         FACILITY:  [facility]   PHYSICIAN:  Bunnie Pion, MD   DATE OF BIRTH:  07-27-1985   DATE OF PROCEDURE:  07/25/2006  DATE OF DISCHARGE:                               OPERATIVE REPORT   PREOP DIAGNOSIS:  Gastrostomy tube.   POSTOP DIAGNOSIS:  Gastrostomy tube.   OPERATION PERFORMED:  Change of gastrostomy tube, Bard 18 French, 1.7  cm.   DESCRIPTION OF PROCEDURE:  The procedure was done in the Endoscopy  Suite.  After adequate sedation was achieved, the Bard button was  removed using the appropriate device.  This was replaced with a new 18-  French 1.7-cm device.  The patient tolerated the procedure and was  returned to the care of the parents and discharged from the Endoscopy  Suite.      Bunnie Pion, MD  Electronically Signed     TMW/MEDQ  D:  07/26/2006  T:  07/26/2006  Job:  [id number]

## 2010-08-28 NOTE — Op Note (Signed)
   NAME:  AZELL, BILL A                          ACCOUNT NO.:  [id number]   MEDICAL RECORD NO.:  [id number]                   PATIENT TYPE:  AMB   LOCATION:  ENDO                                 FACILITY:  [facility]   PHYSICIAN:  Prabhakar D. Pendse, M.D.           DATE OF BIRTH:  1985-10-07   DATE OF PROCEDURE:  10/18/2002  DATE OF DISCHARGE:                                 OPERATIVE REPORT   PREOPERATIVE DIAGNOSES:  1. Nonfunctioning gastrostomy button.  2. Cornelia de Lange syndrome.   POSTOPERATIVE DIAGNOSES:  1. Nonfunctioning gastrostomy button.  2. Cornelia de Lange syndrome.   OPERATIONS PERFORMED:  1. Removal of old nonfunctioning gastrostomy button.  2. Placement of new #18, 1.7-cm Bard button.   SURGEON:  Prabhakar D. Levie Heritage, M.D.   ASSISTANT:  Nurse.   ANESTHESIA:  Topical  EMLA cream.   PROCEDURE:  Under satisfactory topical EMLA cream analgesia, the previously-  placed nonfunctioning gastrostomy button was removed by manipulation.  The  area was cleansed and K-Y jelly applied.  A new #18, 1.7-cm Bard button was  placed with manipulation.  The button was irrigated with about 60 cc of  saline.  There was no leakage noted.  Appropriate dressing applied.  Instructions were given to the parent regarding the care of the button.  The  patient was discharged to be followed as an outpatient.                                               Prabhakar D. Levie Heritage, M.D.    PDP/MEDQ  D:  10/18/2002  T:  10/18/2002  Job:  [id number]

## 2011-01-25 LAB — BASIC METABOLIC PANEL
CO2: 27
Chloride: 104
Potassium: 4.6
Sodium: 138

## 2011-01-25 LAB — CBC
MCHC: 33.6
Platelets: 239
RDW: 12.5
WBC: 7.8

## 2011-01-27 ENCOUNTER — Inpatient Hospital Stay (INDEPENDENT_AMBULATORY_CARE_PROVIDER_SITE_OTHER)
Admission: RE | Admit: 2011-01-27 | Discharge: 2011-01-27 | Disposition: A | Discharge status: Home / Self Care | Payer: 59 | Source: Ambulatory Visit | Attending: Emergency Medicine | Admitting: Emergency Medicine

## 2011-01-27 DIAGNOSIS — S0100XA Unspecified open wound of scalp, initial encounter: Secondary | ICD-10-CM

## 2011-06-03 ENCOUNTER — Encounter (HOSPITAL_COMMUNITY): Payer: Self-pay | Admitting: Pharmacy Technician

## 2011-06-10 ENCOUNTER — Other Ambulatory Visit (HOSPITAL_COMMUNITY): Payer: 59

## 2011-06-12 NOTE — Pre-Procedure Instructions (Signed)
20 NEFTALI ABAIR  06/12/2011      Your procedure is scheduled on:  Friday, MARCH 8TH  Report to [facility] at   5:30 AM.   Call this number if you have problems the morning of surgery: [phone]   Remember:    Do not eat food:After Midnight  Thursday.   May have clear liquids: up to 4 Hours before arrival time --- 1:30 AM.  Clear liquids include soda, tea, black coffee, apple or grape juice, broth.   Take these medicines the morning of surgery with A SIP OF WATER:  Zyrtec, Nexium   Do not wear jewelry, make-up or nail polish.   Do not wear lotions, powders, or perfumes. You may wear deodorant.             Do not bring valuables to the hospital.   Contacts, dentures or bridgework may not be worn into surgery.  Leave suitcase in the car. After surgery it may be brought to your room.  For patients admitted to the hospital, checkout time is 11:00 AM the day of discharge.   Patients discharged the day of surgery will not be allowed to drive home.  Name and phone number of your driver: Clydie Braun  And / or  Dereck Agerton  Special Instructions: CHG Shower Use Special Wash: 1/2 bottle night before surgery and 1/2 bottle morning of surgery.   Please read over the following fact sheets that you were given: Pain Booklet, MRSA Information and Surgical Site Infection Prevention

## 2011-06-14 ENCOUNTER — Encounter (HOSPITAL_COMMUNITY): Payer: Self-pay | Admitting: Vascular Surgery

## 2011-06-14 ENCOUNTER — Encounter (HOSPITAL_COMMUNITY): Payer: Self-pay

## 2011-06-14 ENCOUNTER — Encounter (HOSPITAL_COMMUNITY)
Admission: RE | Admit: 2011-06-14 | Discharge: 2011-06-14 | Disposition: A | Discharge status: Home / Self Care | Payer: 59 | Source: Ambulatory Visit | Attending: Dentistry | Admitting: Dentistry

## 2011-06-14 HISTORY — DX: Cardiac murmur, unspecified: R01.1

## 2011-06-14 HISTORY — DX: Other congenital malformation syndromes predominantly associated with short stature: Q87.19

## 2011-06-14 HISTORY — DX: Acute upper respiratory infection, unspecified: J06.9

## 2011-06-14 HISTORY — DX: Gastro-esophageal reflux disease without esophagitis: K21.9

## 2011-06-14 HISTORY — DX: Encounter for other specified aftercare: Z51.89

## 2011-06-14 HISTORY — DX: Personal history of (corrected) congenital malformations of heart and circulatory system: Z87.74

## 2011-06-14 HISTORY — DX: Reserved for inherently not codable concepts without codable children: IMO0001

## 2011-06-14 NOTE — Progress Notes (Addendum)
HAS YEAST INFECTION AROUND DIAPER AREA..  ON  CREAM  NOW.  PT  SEES  DR Marylu Lund DEES AT [facility] (SAW LAST Wednesday) CARDIOLOGIST IS DR COTTON [facility]--SUPPOSE TO GO SEE THEM IN A COUPLE OF WEEKS.  HAVE REQUESTED NOTES FROM DR Avis Epley....WILL ALSO  GET SOME RECORDS FROM  DR  COTTON.Jaynie Collins, PA  REVIEWING THE CHART AND WILL SEE PATIENT BEFORE THEY LEAVE...Marland KitchenMarland Kitchen

## 2011-06-14 NOTE — Consult Note (Addendum)
Anesthesia:  Patient is a 26 year old male with a history of Cornelia de Lange Syndrome and s/p Tetralogy of Fallot repair at age 89 Dorena Cookey).  He is scheduled for dental restoration on 06/18/11.  (Patient's H&P from Dr. Marylu Lund Dee's received after patient left PAT, also mentioned that Dr. Luciana Axe is planning to do an in-depth ophthalmological evaluation while under anesthesia due to his history of detached retinas.)  Mom reports she has been told he has a small airway, but has not necessarily been told that he is a difficult intubation.  He presents with his mother and grandfather. He is small in stature only weighing 50 lbs with estimated height at 4 ft.  He sits in a wheel chair and requires protective sleeves on his arms and hands for what appears to be involuntary arm movements.  He is none verbal with poor vision and hearing.  He has a gastrostomy "button" that is sometimes used for medication administration, but mom says that typically he can eat soft foods by mouth.  He is prone to reflux.  Mom feels he has been in his usual state of health.  He did complete an antibiotic regimen one week ago for sinusitis, but has not had any significant respiratory symptoms or fever since.  He has tolerated multiple procedures within the last few years includes dental, eye (retinal detachment), and gastrostomy surgeries.  His Cardiologist is Dr. Dalene Seltzer at [facility].  He was last seen in March of 2011.  Patient is seen approximately every two years, and actually has an appointment in a few weeks.  According to Dr. 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  At his last visit, his mother expressed that they did not want any cardiac intervention done at that time so no further testing was ordered.  She confirms that this is still her opinion, particularly since he appears symptomatically stable.   Mom reported that his last echo was done at [facility].  Records  indicated that this was done on 08/20/03 and showed s/p tetralogy of Fallot repair, PFO with predominantly left to right shunt, dilated RA, mild TR, no MR, right ventricular systolic pressure at least 35 mmHg plus the right atrial pressure, left heart chambers mildly compresses with normal LV wall thickness  And mildly to moderately decreased LV function, severely dilated RV with normal wall thickness and grossly normal function, no residual VSD, dilated main PA with free PR and no stenosis, right PA appears normal in size, left PA origin appears moderately stenotic, peak velocity of 2 m/s into each branch PA, right aortic arch appears unobstructed.  His last EKG from 11/04/06 showed NSR, LAE, right BBB, RAE, non-specific ST changes, and prolonged QT.  CXR from 06/26/10 showed no active lung disease. Stable cardiomegaly and prominent pulmonary artery segments.  In the past, he has required labs to be drawn after administration of anesthesia.  Mom denies any known significant lab abnormalities.    Physical exam findings show RRR with a II/VI SEM.  Lungs sounds were slightly coarse.  (Cardiopulmonary exam was somewhat difficult due to patients vocal sounds.)  Skin color was pale but lips pink.  Earlier today I reviewed his history, echo, cardiac notes, etc, with Anesthesiologist Dr. Chaney Malling.  If no significant change in his status, then plan to proceed without further testing.

## 2011-06-15 NOTE — Progress Notes (Signed)
Spoke to Jerrye Beavers at Dr. Orma Render office to confirm surgeon for this case. She stated  Dr. Geannie Risen is doing  The procedure.  She also  confirmed Dr. Luciana Axe would also be doing an ophthalmological evaluation, the mother of the patient was given dilating drops to put in and they did not need to sign a consent form for Dr. Luciana Axe.

## 2011-06-17 ENCOUNTER — Other Ambulatory Visit: Payer: Self-pay | Admitting: Ophthalmology

## 2011-06-17 NOTE — H&P (Signed)
  26 Y/O MUTE, LOW VISION PATIENT WITH CORNELIA DE LANGE SYNDROME, SEVERE MENTAL RETARDATION AND GROWTH RETARDATION, MULTIPLE HEART DEFECTS, AND  WITH HISTORY OF BILATERAL SEVERE VISION LOSS, FROM SPONTANEOUS RETINAL DETACHMENT.    NOW FOR EXAMINATION OF GLOBES, FUNDUS OS, AND CORNEA, AFTER COMPLEX REPAIR OF RETINAL DETACHMENT, CORNEAL SCARRING AND FLOPPY EYELID SYNDROME SOME 2.5 YEARS AGO.    PLAN IS TO EVALUATE STRUCTURES OF GLOBE, OCULAR SURFACE, GLAUCOMA TESTING, AND DILATED FUNDUS EXAM UNDER GENERAL ANESTHESIA PRIOR TO DENTAL SURGERY IN OPERATING THEATER BY DR Quincy Simmonds.  , ,

## 2011-06-18 ENCOUNTER — Encounter (HOSPITAL_COMMUNITY): Payer: Self-pay | Admitting: Vascular Surgery

## 2011-06-18 ENCOUNTER — Encounter (HOSPITAL_COMMUNITY)
Admission: RE | Disposition: A | Discharge status: Hospice | Payer: Self-pay | Source: Ambulatory Visit | Attending: Pediatric Dentistry

## 2011-06-18 ENCOUNTER — Ambulatory Visit (HOSPITAL_COMMUNITY): Payer: 59 | Admitting: Vascular Surgery

## 2011-06-18 ENCOUNTER — Other Ambulatory Visit: Payer: Self-pay | Admitting: Ophthalmology

## 2011-06-18 ENCOUNTER — Encounter (HOSPITAL_COMMUNITY): Payer: Self-pay | Admitting: *Deleted

## 2011-06-18 ENCOUNTER — Ambulatory Visit (HOSPITAL_COMMUNITY)
Admission: RE | Admit: 2011-06-18 | Discharge: 2011-06-18 | Disposition: A | Discharge status: Hospice | Payer: 59 | Source: Ambulatory Visit | Attending: Pediatric Dentistry | Admitting: Pediatric Dentistry

## 2011-06-18 DIAGNOSIS — Q898 Other specified congenital malformations: Secondary | ICD-10-CM | POA: Insufficient documentation

## 2011-06-18 DIAGNOSIS — Z01812 Encounter for preprocedural laboratory examination: Secondary | ICD-10-CM | POA: Insufficient documentation

## 2011-06-18 DIAGNOSIS — H2700 Aphakia, unspecified eye: Secondary | ICD-10-CM | POA: Insufficient documentation

## 2011-06-18 DIAGNOSIS — F72 Severe intellectual disabilities: Secondary | ICD-10-CM | POA: Insufficient documentation

## 2011-06-18 DIAGNOSIS — K029 Dental caries, unspecified: Secondary | ICD-10-CM | POA: Insufficient documentation

## 2011-06-18 HISTORY — PX: EYE EXAMINATION UNDER ANESTHESIA: SHX1560

## 2011-06-18 HISTORY — PX: TOOTH EXTRACTION: SHX859

## 2011-06-18 SURGERY — DENTAL RESTORATION/EXTRACTIONS
Anesthesia: General | Site: Mouth | Wound class: Clean Contaminated

## 2011-06-18 MED ORDER — CYCLOPENTOLATE HCL 1 % OP SOLN
OPHTHALMIC | Status: DC | PRN
  Administered 2011-06-18: 1 [drp] via OPHTHALMIC

## 2011-06-18 MED ORDER — CYCLOPENTOLATE HCL 1 % OP SOLN
1.0000 [drp] | OPHTHALMIC | Status: DC
  Filled 2011-06-18: qty 2

## 2011-06-18 MED ORDER — BSS IO SOLN
INTRAOCULAR | Status: DC | PRN
  Administered 2011-06-18: 1 via INTRAOCULAR

## 2011-06-18 MED ORDER — ONDANSETRON HCL 4 MG/2ML IJ SOLN
INTRAMUSCULAR | Status: DC | PRN
  Administered 2011-06-18: 1.5 mg via INTRAVENOUS

## 2011-06-18 MED ORDER — STERILE WATER FOR INJECTION IJ SOLN
25.0000 mg/kg | INTRAMUSCULAR | Status: AC
  Administered 2011-06-18: 567.5 mg via INTRAVENOUS
  Filled 2011-06-18: qty 5.7

## 2011-06-18 MED ORDER — OXYMETAZOLINE HCL 0.05 % NA SOLN
NASAL | Status: DC | PRN
  Administered 2011-06-18: 2 via NASAL

## 2011-06-18 MED ORDER — CYCLOPENTOLATE-PHENYLEPHRINE 0.2-1 % OP SOLN: OPHTHALMIC | Status: DC | PRN

## 2011-06-18 MED ORDER — PHENYLEPHRINE HCL 2.5 % OP SOLN
OPHTHALMIC | Status: DC | PRN
  Administered 2011-06-18: 1 [drp] via OPHTHALMIC

## 2011-06-18 MED ORDER — PROPOFOL 10 MG/ML IV EMUL
INTRAVENOUS | Status: DC | PRN
  Administered 2011-06-18 (×2): 50 mg via INTRAVENOUS

## 2011-06-18 MED ORDER — DEXTROSE-NACL 5-0.2 % IV SOLN
INTRAVENOUS | Status: DC | PRN
  Administered 2011-06-18: 08:00:00 via INTRAVENOUS

## 2011-06-18 MED ORDER — PHENYLEPHRINE HCL 2.5 % OP SOLN
1.0000 [drp] | OPHTHALMIC | Status: DC
  Filled 2011-06-18: qty 2

## 2011-06-18 MED ORDER — FENTANYL CITRATE 0.05 MG/ML IJ SOLN
INTRAMUSCULAR | Status: DC | PRN
  Administered 2011-06-18: 50 ug via INTRAVENOUS
  Administered 2011-06-18: 25 ug via INTRAVENOUS

## 2011-06-18 SURGICAL SUPPLY — 38 items
APPLICATOR COTTON TIP 6IN STRL (MISCELLANEOUS) ×1 IMPLANT
ATTRACTOMAT 16X20 MAGNETIC DRP (DRAPES) IMPLANT
BANDAGE CONFORM 3  STR LF (GAUZE/BANDAGES/DRESSINGS) ×2 IMPLANT
BLADE SURG 15 STRL LF DISP TIS (BLADE) ×6 IMPLANT
BLADE SURG 15 STRL SS (BLADE)
BUR SIDE CUT (BURR) IMPLANT
BUR SIDE CUT 44.8 STRL (BURR) IMPLANT
BURR SIDE CUT 44.8 STRL (BURR)
CANISTER SUCTION 2500CC (MISCELLANEOUS) ×2 IMPLANT
CLOTH BEACON ORANGE TIMEOUT ST (SAFETY) ×3 IMPLANT
CONT SPEC 4OZ CLIKSEAL STRL BL (MISCELLANEOUS) ×2 IMPLANT
COVER SURGICAL LIGHT HANDLE (MISCELLANEOUS) ×3 IMPLANT
GAUZE PACKING FOLDED 2  STR (GAUZE/BANDAGES/DRESSINGS)
GAUZE PACKING FOLDED 2 STR (GAUZE/BANDAGES/DRESSINGS) IMPLANT
GAUZE SPONGE 4X4 16PLY XRAY LF (GAUZE/BANDAGES/DRESSINGS) ×3 IMPLANT
GLOVE BIO SURGEON STRL SZ 6.5 (GLOVE) ×3 IMPLANT
GOWN STRL NON-REIN LRG LVL3 (GOWN DISPOSABLE) ×4 IMPLANT
HEMOSTAT SURGICEL 2X14 (HEMOSTASIS) IMPLANT
KIT BASIN OR (CUSTOM PROCEDURE TRAY) ×3 IMPLANT
KIT ROOM TURNOVER OR (KITS) ×3 IMPLANT
NDL BLUNT 16X1.5 OR ONLY (NEEDLE) ×4 IMPLANT
NDL DENTAL 27 LONG (NEEDLE) ×4 IMPLANT
NEEDLE BLUNT 16X1.5 OR ONLY (NEEDLE) IMPLANT
NEEDLE DENTAL 27 LONG (NEEDLE) IMPLANT
NS IRRIG 1000ML POUR BTL (IV SOLUTION) ×2 IMPLANT
PACK EENT II TURBAN DRAPE (CUSTOM PROCEDURE TRAY) ×3 IMPLANT
PAD ARMBOARD 7.5X6 YLW CONV (MISCELLANEOUS) ×4 IMPLANT
SOLUTION BETADINE 4OZ (MISCELLANEOUS) IMPLANT
SPONGE GAUZE 4X4 12PLY (GAUZE/BANDAGES/DRESSINGS) IMPLANT
SUT CHROMIC 3 0 PS 2 (SUTURE) ×6 IMPLANT
SUT VIC AB 3-0 FS2 27 (SUTURE) IMPLANT
SYR 50ML SLIP (SYRINGE) ×2 IMPLANT
SYR BULB 3OZ (MISCELLANEOUS) IMPLANT
TOOTHBRUSH ADULT (PERSONAL CARE ITEMS) IMPLANT
TOWEL OR 17X24 6PK STRL BLUE (TOWEL DISPOSABLE) ×3 IMPLANT
TOWEL OR 17X26 10 PK STRL BLUE (TOWEL DISPOSABLE) ×3 IMPLANT
TUBE CONNECTING 12X1/4 (SUCTIONS) ×2 IMPLANT
WATER STERILE IRR 1000ML POUR (IV SOLUTION) ×3 IMPLANT

## 2011-06-18 NOTE — Brief Op Note (Signed)
06/18/2011  9:58 AM   PATIENT:  Justin Watson  26 y.o. male  PRE-OPERATIVE DIAGNOSIS:  DENTAL CARIES  POST-OPERATIVE DIAGNOSIS:  DENTAL CARIES  PROCEDURE:  Procedure(s) (LRB): DENTAL RESTORATION/EXTRACTIONS (N/A) EYE EXAM UNDER ANESTHESIA (Bilateral)  SURGEON:  Surgeon(s) and Role: Panel 1:    * Eldridge Abrahams, DDS - Primary  Panel 2:    * Edmon Crape, MD - Primary  PHYSICIAN ASSISTANT:   ASSISTANTS:Emily Hilbert Bible   ANESTHESIA:   general  EBL:     BLOOD ADMINISTERED:none  DRAINS: none   LOCAL MEDICATIONS USED:  LIDOCAINE   SPECIMEN:  No Specimen  DISPOSITION OF SPECIMEN:  N/A  COUNTS:  YES  TOURNIQUET:  * No tourniquets in log *  DICTATION: .Note written in paper chart  PLAN OF CARE: Discharge to home after PACU  PATIENT DISPOSITION:  PACU - hemodynamically stable.   Delay start of Pharmacological VTE agent (>24hrs) due to surgical blood loss or risk of bleeding: no

## 2011-06-18 NOTE — Anesthesia Preprocedure Evaluation (Addendum)
Anesthesia Evaluation  Patient identified by MRN, date of birth, ID band Patient awake    Reviewed: Allergy & Precautions, H&P , NPO status , Patient's Chart, lab work & pertinent test results  History of Anesthesia Complications (+) DIFFICULT AIRWAY  Airway       Dental   Pulmonary Recent URI ,          Cardiovascular + Valvular Problems/Murmurs     Neuro/Psych    GI/Hepatic GERD-  Medicated,  Endo/Other    Renal/GU      Musculoskeletal   Abdominal   Peds  Hematology   Anesthesia Other Findings   Reproductive/Obstetrics                          Anesthesia Physical Anesthesia Plan  ASA: II  Anesthesia Plan:    Post-op Pain Management:    Induction: Intravenous  Airway Management Planned: Nasal ETT and Oral ETT  Additional Equipment:   Intra-op Plan:   Post-operative Plan: Extubation in OR  Informed Consent: I have reviewed the patients History and Physical, chart, labs and discussed the procedure including the risks, benefits and alternatives for the proposed anesthesia with the patient or authorized representative who has indicated his/her understanding and acceptance.     Plan Discussed with: CRNA, Anesthesiologist and Surgeon  Anesthesia Plan Comments:         Anesthesia Quick Evaluation

## 2011-06-18 NOTE — Progress Notes (Signed)
Patient calmer in his stroller.

## 2011-06-18 NOTE — Progress Notes (Signed)
Spoke with  DR Katrinka Blazing RE PATIENTS HISTORY AND NO BLOOD BEING DRAWN YET, DR Katrinka Blazing STATED THEY COULD GET IT WHEN THEY START THE IV.

## 2011-06-18 NOTE — Discharge Instructions (Signed)
Take tylenol for pain as needed. No toothbrushing until tomorrow. Call the office as needed [phone]. Follow up as needed or 6 months for routine visit.

## 2011-06-18 NOTE — Transfer of Care (Signed)
Immediate Anesthesia Transfer of Care Note  Patient: Justin Watson  Procedure(s) Performed: Procedure(s) (LRB): DENTAL RESTORATION/EXTRACTIONS (N/A) EYE EXAM UNDER ANESTHESIA (Bilateral)  Patient Location: PACU  Anesthesia Type: General  Level of Consciousness: awake  Airway & Oxygen Therapy: Patient Spontanous Breathing and Patient connected to face mask oxygen  Post-op Assessment: Report given to PACU RN and Post -op Vital signs reviewed and stable  Post vital signs: Reviewed and stable  Complications: No apparent anesthesia complications

## 2011-06-18 NOTE — Progress Notes (Signed)
Pt. remains restless, both mother and grandfather at bedside. Pulled monitor off, pulled IV out, catheter intact. Per mother, this is usual for patient after anesthesia.

## 2011-06-18 NOTE — Anesthesia Procedure Notes (Signed)
Procedure Name: Intubation Date/Time: 06/18/2011 8:19 AM Performed by: Elizbeth Squires Pre-anesthesia Checklist: Patient identified, Emergency Drugs available, Suction available and Patient being monitored Patient Re-evaluated:Patient Re-evaluated prior to inductionOxygen Delivery Method: Circle system utilized Preoxygenation: Pre-oxygenation with 100% oxygen Intubation Type: Combination inhalational/ intravenous induction Ventilation: Mask ventilation without difficulty and Oral airway inserted - appropriate to patient size Laryngoscope Size: Mac and 3 Grade View: Grade I Tube type: Oral Tube size: 4.5 mm Number of attempts: 4 Airway Equipment and Method: Stylet Placement Confirmation: ETT inserted through vocal cords under direct vision,  positive ETCO2 and breath sounds checked- equal and bilateral Tube secured with: Tape Dental Injury: Bloody posterior oropharynx  Difficulty Due To: Difficulty was anticipated Comments: DVL x 1 with MAC 2 for nasal intubation. Nasal ETT passed easily thru left nares with red rubber. 4.5 Nasal Raeon first attempt.  Unable to pass thru cords.  Switched to Glidescope to share view.  Unable to pass ETT thru cords.  Ett switched to 4.0 Nasal Sheilah Pigeon - unable to pass thru cords.  Switched to 4.5 ETT with cuff.  Passed thru cords by Dr. Katrinka Blazing on 4th attempt.

## 2011-06-18 NOTE — OR Nursing (Signed)
1.25mL of 1% lidocaine with epinephrine (1:100,000) given by Quincy Simmonds, DDS. Local brought in from [place] office and administered intraoperatively.

## 2011-06-18 NOTE — Anesthesia Postprocedure Evaluation (Signed)
  Anesthesia Post-op Note  Patient: Justin Watson  Procedure(s) Performed: Procedure(s) (LRB): DENTAL RESTORATION/EXTRACTIONS (N/A) EYE EXAM UNDER ANESTHESIA (Bilateral)  Patient Location: PACU  Anesthesia Type: General  Level of Consciousness: awake and alert   Airway and Oxygen Therapy: Patient Spontanous Breathing  Post-op Pain: none  Post-op Assessment: Post-op Vital signs reviewed, Patient's Cardiovascular Status Stable, Respiratory Function Stable, Patent Airway, No signs of Nausea or vomiting and Pain level controlled  Post-op Vital Signs: stable  Complications: No apparent anesthesia complications

## 2011-06-18 NOTE — Progress Notes (Signed)
Dr g Katrinka Blazing called re need to draw labs in holding or after sedated. Dr Luciana Axe to exam eyes after sedated per note in epic and mother.

## 2011-06-18 NOTE — Brief Op Note (Signed)
06/18/2011  8:27 AM  PATIENT:  Justin Watson  26 y.o. male  PRE-OPERATIVE DIAGNOSIS:  DENTAL CARIES,   And history of complex retinal detachment, corneal scarring, and floppy eyelid syndrome OS.  HIGH MYOPIA OS.  APHAKIA OS.  RETAINED SILICONE OIL OS.  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  POST-OPERATIVE DIAGNOSIS: OPHTHALMOLOGY:  SAME  PROCEDURE:  Procedure(s) (LRB): DENTAL RESTORATION/EXTRACTIONS (N/A) EYE EXAM UNDER ANESTHESIA (Bilateral) PROCEDURE:   PANEL 2:   EXAMINATION UNDER ANESTHESIA OS. SURGEON:  Surgeon(s) and Role: Panel 1:    * Eldridge Abrahams, DDS - Primary  Panel 2:    * Edmon Crape, MD - Primary  PHYSICIAN ASSISTANT:   ASSISTANTS: none   ANESTHESIA:   general  EBL:   0  BLOOD ADMINISTERED:none  DRAINS: none   LOCAL MEDICATIONS USED:  NONE  SPECIMEN:  No Specimen  DISPOSITION OF SPECIMEN:  N/A  COUNTS:  YES  TOURNIQUET:  * No tourniquets in log *  DICTATION: .Other Dictation: Dictation Number [id number]  PLAN OF CARE: Discharge to home after PACU  PATIENT DISPOSITION:  I EXITED OR WITH PATIENT INTUBATED, STABLE   Delay start of Pharmacological VTE agent (>24hrs) due to surgical blood loss or risk of bleeding: not applicable

## 2011-06-18 NOTE — OR Nursing (Signed)
Eye exam under anesthesia performed by Fawn Kirk, MD from 8:19 to 8:24. Dental restoration by Sigmund Hazel, DDS started at 8:38.

## 2011-06-19 NOTE — Op Note (Signed)
Justin Watson, Justin Watson                ACCOUNT NO.:  [id number]  MEDICAL RECORD NO.:  [id number]  LOCATION:  [facility]                         FACILITY:  [facility]  PHYSICIAN:  Jillyn Hidden A. Mercia Dowe, M.D.   DATE OF BIRTH:  11/29/1985  DATE OF PROCEDURE:  06/18/2011 DATE OF DISCHARGE:                              OPERATIVE REPORT   PREOPERATIVE DIAGNOSES: 1. History of complex retinal detachment and repair, left eye. 2. History of corneal scarring and repair, left eye. 3. 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  POSTOPERATIVE DIAGNOSES: 1. History of complex retinal detachment and repair, left eye. 2. History of corneal scarring and repair, left eye. 3. 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 4. Retina is attached. 5. 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 6. Intraocular pressures are 4 in the left eye.  PROCEDURE:  Examination under anesthesia.  SURGEON:  Alford Highland. Yovanna Cogan, MD  ANESTHESIA:  General anesthesia.  This procedure examination proceeded first prior to dental workup by Dr. Quincy Simmonds.  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  The patient is uncooperative in the office, and this is an attempt  to reassess the status of the retina and determine why the patient while apparently has some vision, still has some significant limitations in the home in terms of mobilization.  The patient's family understands the risk of anesthesia, understands that there is also another procedure going on.  The family understands this is simply a diagnostic examination only.   No therapy will be undertaken today.  DESCRIPTION OF PROCEDURE:  Appropriate signed consent was obtained.  The patient was taken to the operating room.  In the operating room, general anesthesia through the endotracheal oral intubation was completed. Preoperative drops of CycloGel and phenylephrine were given and given time to work in the OR and in fact did allow for excellent visualization of the retina.  The anterior segment is noted for superficial fibrous ingrowth mostly from the superotemporal quadrant of the corneum with a somewhat 60% opacification of the medial 3/4th cornea.  Inferotemporal portion of the cornea had some windows of visualization which allowed with 30 diopter lens and indirect ophthalmoscopy and visualization of fundus and the retina was entirely attached.  Vessels were glimpsed. Nerves were seen.  The retina was attached.  Oil is clear.  No details of the Iris in the anterior segment other than the corneal opacification could be ascertained.  The intraocular pressure had been assessed with a tonometer pin and I provided and found to have intraocular pressure of 4 mm upon examination.  At this time, my procedure was closed.  Lid speculum was removed.  No trauma to the surface of the eye had occurred. Ophthalmic ointments without medications were applied for lubrication so that the dental part of the procedure with Dr. Mariam Dollar could proceed.  I exited the room at this  time.  Follow up will be as scheduled as my office will call the patient and the family.  These findings have been discussed with the family.     Alford Highland Tiney Zipper, M.D.     GAR/MEDQ  D:  06/18/2011  T:  06/18/2011  Job:  [id number]  cc:   Tor Netters, D.D.S.

## 2011-06-21 ENCOUNTER — Encounter (HOSPITAL_COMMUNITY): Payer: Self-pay | Admitting: Pediatric Dentistry

## 2011-11-12 ENCOUNTER — Encounter (HOSPITAL_COMMUNITY): Payer: Self-pay | Admitting: Emergency Medicine

## 2011-11-12 ENCOUNTER — Emergency Department (HOSPITAL_COMMUNITY): Payer: 59

## 2011-11-12 ENCOUNTER — Emergency Department (HOSPITAL_COMMUNITY)
Admission: EM | Admit: 2011-11-12 | Discharge: 2011-11-12 | Disposition: A | Discharge status: Home / Self Care | Payer: 59 | Attending: Emergency Medicine | Admitting: Emergency Medicine

## 2011-11-12 DIAGNOSIS — Z888 Allergy status to other drugs, medicaments and biological substances status: Secondary | ICD-10-CM | POA: Insufficient documentation

## 2011-11-12 DIAGNOSIS — IMO0002 Reserved for concepts with insufficient information to code with codable children: Secondary | ICD-10-CM | POA: Insufficient documentation

## 2011-11-12 DIAGNOSIS — T17308A Unspecified foreign body in larynx causing other injury, initial encounter: Secondary | ICD-10-CM | POA: Insufficient documentation

## 2011-11-12 DIAGNOSIS — Y92009 Unspecified place in unspecified non-institutional (private) residence as the place of occurrence of the external cause: Secondary | ICD-10-CM | POA: Insufficient documentation

## 2011-11-12 DIAGNOSIS — Z882 Allergy status to sulfonamides status: Secondary | ICD-10-CM | POA: Insufficient documentation

## 2011-11-12 DIAGNOSIS — R0989 Other specified symptoms and signs involving the circulatory and respiratory systems: Secondary | ICD-10-CM

## 2011-11-12 DIAGNOSIS — K219 Gastro-esophageal reflux disease without esophagitis: Secondary | ICD-10-CM | POA: Insufficient documentation

## 2011-11-12 DIAGNOSIS — R011 Cardiac murmur, unspecified: Secondary | ICD-10-CM | POA: Insufficient documentation

## 2011-11-12 DIAGNOSIS — Z91011 Allergy to milk products: Secondary | ICD-10-CM | POA: Insufficient documentation

## 2011-11-12 DIAGNOSIS — Q898 Other specified congenital malformations: Secondary | ICD-10-CM | POA: Insufficient documentation

## 2011-11-12 DIAGNOSIS — Z79899 Other long term (current) drug therapy: Secondary | ICD-10-CM | POA: Insufficient documentation

## 2011-11-12 NOTE — ED Provider Notes (Signed)
History     CSN: [id number]  Arrival date & time 11/12/11  2055   First MD Initiated Contact with Patient 11/12/11 2309      Chief Complaint  Patient presents with  . Gastrophageal Reflux  . Shortness of Breath    (Consider location/radiation/quality/duration/timing/severity/associated sxs/prior treatment) HPI Patient is a 26 year old male with history of Cornelia de Lange syndrome. Has history of reflux and frequent choking episodes. Patient is here this evening with parents because he had a worse episode than usual.  Family reports this lasted approximately 20 minutes and patient became slightly cyanotic with it. Patient has improved since arrival. He has had no fevers. Patient is back to baseline. Family has noted some increasing difficulties with this. Patient is unable to effectively cough most of the time. He is having no difficulty breathing currently. Patient had no other concerning symptoms. He has not had recent fevers. Blood pressures are frequently between 90 and 100 systolic. There no other associated or modifying factors. Past Medical History  Diagnosis Date  . Heart murmur   . Tetralogy of Fallot s/p repair     1991  [facility]  . Recurrent upper respiratory infection (URI)     sinus   . Blood transfusion     mom not sure when however   . GERD (gastroesophageal reflux disease)   . Cornelia de Lange syndrome   . Difficult intubation     SMALL  AIRWAY  . PFO (patent foramen ovale)     predominantly left to right shunt by '05 echo    Past Surgical History  Procedure Date  . Gastrostomy tube placement     1989  . Tetralogy of fallot repair     1991  . Hernia repair     BILATERAL  INGUINAL  . Joint replacement     BIL  HIP   FOR   DISLOCATION  92 & 94  . Duodenoal by pass     2001  . Appendectomy   . Eye surgery   . Meckel diverticulum excision   . Tooth extraction 06/18/2011    Procedure: DENTAL RESTORATION/EXTRACTIONS;  Surgeon: Eldridge Abrahams, DDS;  Location: [facility] OR;  Service: Oral Surgery;  Laterality: N/A;  Oral examination. Four fillings. Two extractions. Oral prophylaxis cleaning. Five x-rays.  . Eye examination under anesthesia 06/18/2011    Procedure: EYE EXAM UNDER ANESTHESIA;  Surgeon: Edmon Crape, MD;  Location: [facility] OR;  Service: Ophthalmology;  Laterality: Bilateral;    History reviewed. No pertinent family history.  History  Substance Use Topics  . Smoking status: Not on file  . Smokeless tobacco: Not on file  . Alcohol Use: No      Review of Systems  Constitutional: Negative.   HENT: Negative.   Eyes: Negative.   Respiratory: Positive for cough and choking.   Cardiovascular: Negative.   Gastrointestinal: Positive for vomiting.  Genitourinary: Negative.   Musculoskeletal: Negative.   Skin: Negative.   Neurological: Negative.   Hematological: Negative.   Psychiatric/Behavioral: Negative.   All other systems reviewed and are negative.    Allergies  Milk-related compounds; Reglan; and Sulfa antibiotics  Home Medications   Current Outpatient Rx  Name Route Sig Dispense Refill  . ALUM & MAG HYDROXIDE-SIMETH 200-200-20 MG/5ML PO SUSP Oral Take 10 mLs by mouth 4 (four) times daily -  before meals and at bedtime. For acid relief    . BETHANECHOL 1 MG/ML PEDIATRIC ORAL SUSPENSION Oral Take 1.6 mg by mouth  3 (three) times daily.    Marland Kitchen CETIRIZINE HCL 5 MG/5ML PO SYRP Oral Take 5 mg by mouth 2 (two) times daily.    Marland Kitchen CLONAZEPAM 0.1 MG/ML ORAL SUSPENSION Oral Take 0.8 mg by mouth at bedtime as needed. For sleep    . ESOMEPRAZOLE MAGNESIUM 20 MG PO CPDR Oral Take 40 mg by mouth 2 (two) times daily.     Marland Kitchen LACTULOSE 10 GM/15ML PO SOLN Oral Take 10 g by mouth 2 (two) times daily.    . NYSTATIN 100000 UNIT/GM EX OINT Topical Apply 1 application topically daily as needed. For yeast    . RETAPAMULIN 1 % EX OINT Topical Apply topically 2 (two) times daily.      BP 93/56  Pulse 106  Temp 97.6 F (36.4 C)  (Oral)  Resp 20  SpO2 99%  Physical Exam  Nursing note and vitals reviewed. GEN: The patient is a male smaller appearing than stated age with obvious altered facies related to Cornelia de Lange  HEENT: Atraumatic, microcephalic. No obvious acute abnormalities  NECK: Trachea midline, no meningismus CV: regular rate and rhythm. 2/6 systolic murmur. PULM: No respiratory distress.  No crackles, wheezes, or rales. GI: soft, non-tender. No guarding, rebound, or tenderness. + bowel sounds  GU: deferred Neuro: Per family patient is at neurologic baseline. Patient is nonverbal. He sits with his eyes closed. Patient frequently moves all 4 extremities feeling of all the items attached to the bed and to the patient. He is not directable.  MSK: Patient moves all 4 extremities symmetrically, no edema, or injury noted Skin: No rashes petechiae, purpura, or jaundice   ED Course  Procedures (including critical care time)  Labs Reviewed - No data to display Dg Chest 1 View  11/12/2011  *RADIOLOGY REPORT*  Clinical Data: Gastroesophageal reflux and shortness of breath.  CHEST - 1 VIEW  Comparison: 06/26/2010  Findings: Surgical clips in the mediastinum.  Cardiac enlargement with prominence of central pulmonary arteries bilaterally.  Shallow inspiration with elevation of the right hemidiaphragm.  No focal airspace consolidation.  Hazy perihilar appearance suggests mild edema.  No blunting of costophrenic angles.  No pneumothorax.  IMPRESSION: Cardiac enlargement with pulmonary arterial enlargement, stable. Shallow inspiration with elevation of right hemidiaphragm. Possible perihilar edema.  Original Report Authenticated By: Marlon Pel, M.D.     1. Choking episode   2. GERD (gastroesophageal reflux disease)       MDM  Patient was evaluated by myself. Lungs were clear and patient had returned to his baseline. Family was comfortable with patient going home following return of normal chest x-ray.  Patient did not have any infiltrates. Family was advised to followup with his primary care provider as well as possibly with the gastroenterologist is seen in the past as a facilities had increasing reflux.  Family wondered if he should have called EMS this evening. I did advise that the patient is experiencing cyanosis calling 911 would be appropriate. Patient has recently had increase in his Nexium doses. Family is welcome to return if they have any other emergent concerns. Patient was completely at baseline having no wheezing and no other issues this evening. Family agreed with plan not to perform lab work today. Patient was hemodynamically stable and discharged in good condition.        Cyndra Numbers, MD 11/12/11 [phone]

## 2011-11-12 NOTE — ED Notes (Addendum)
Reports cornelia de lange syndrome--has problems with reflux and SOB--threw up tonight, having difficulty breathing tonight; has difficulty coughing up

## 2011-11-12 NOTE — ED Notes (Signed)
Per parents, pt has hx of acid reflux but has gotten worse the past couple months. States today pt woke from sleeping and  For 20-30 minutes had a difficult time breathing. Parents report "wheezing, asthma-like breathing". States pt turned blue around the lips. States pt has hx of this, but it lasted longer this time than normal.

## 2013-03-16 ENCOUNTER — Ambulatory Visit: Payer: 59 | Admitting: Physical Therapy

## 2013-03-27 ENCOUNTER — Ambulatory Visit: Payer: 59 | Attending: Pediatrics | Admitting: Physical Therapy

## 2013-03-27 DIAGNOSIS — M6281 Muscle weakness (generalized): Secondary | ICD-10-CM | POA: Insufficient documentation

## 2013-03-27 DIAGNOSIS — IMO0001 Reserved for inherently not codable concepts without codable children: Secondary | ICD-10-CM | POA: Insufficient documentation

## 2014-02-09 IMAGING — CR DG CHEST 1V
1 series · 1 of 1 positions shown · non-contrast
Comparison: 06/26/2010

CLINICAL DATA: Gastroesophageal reflux and shortness of breath.

CHEST - 1 VIEW

[t chest supine *]
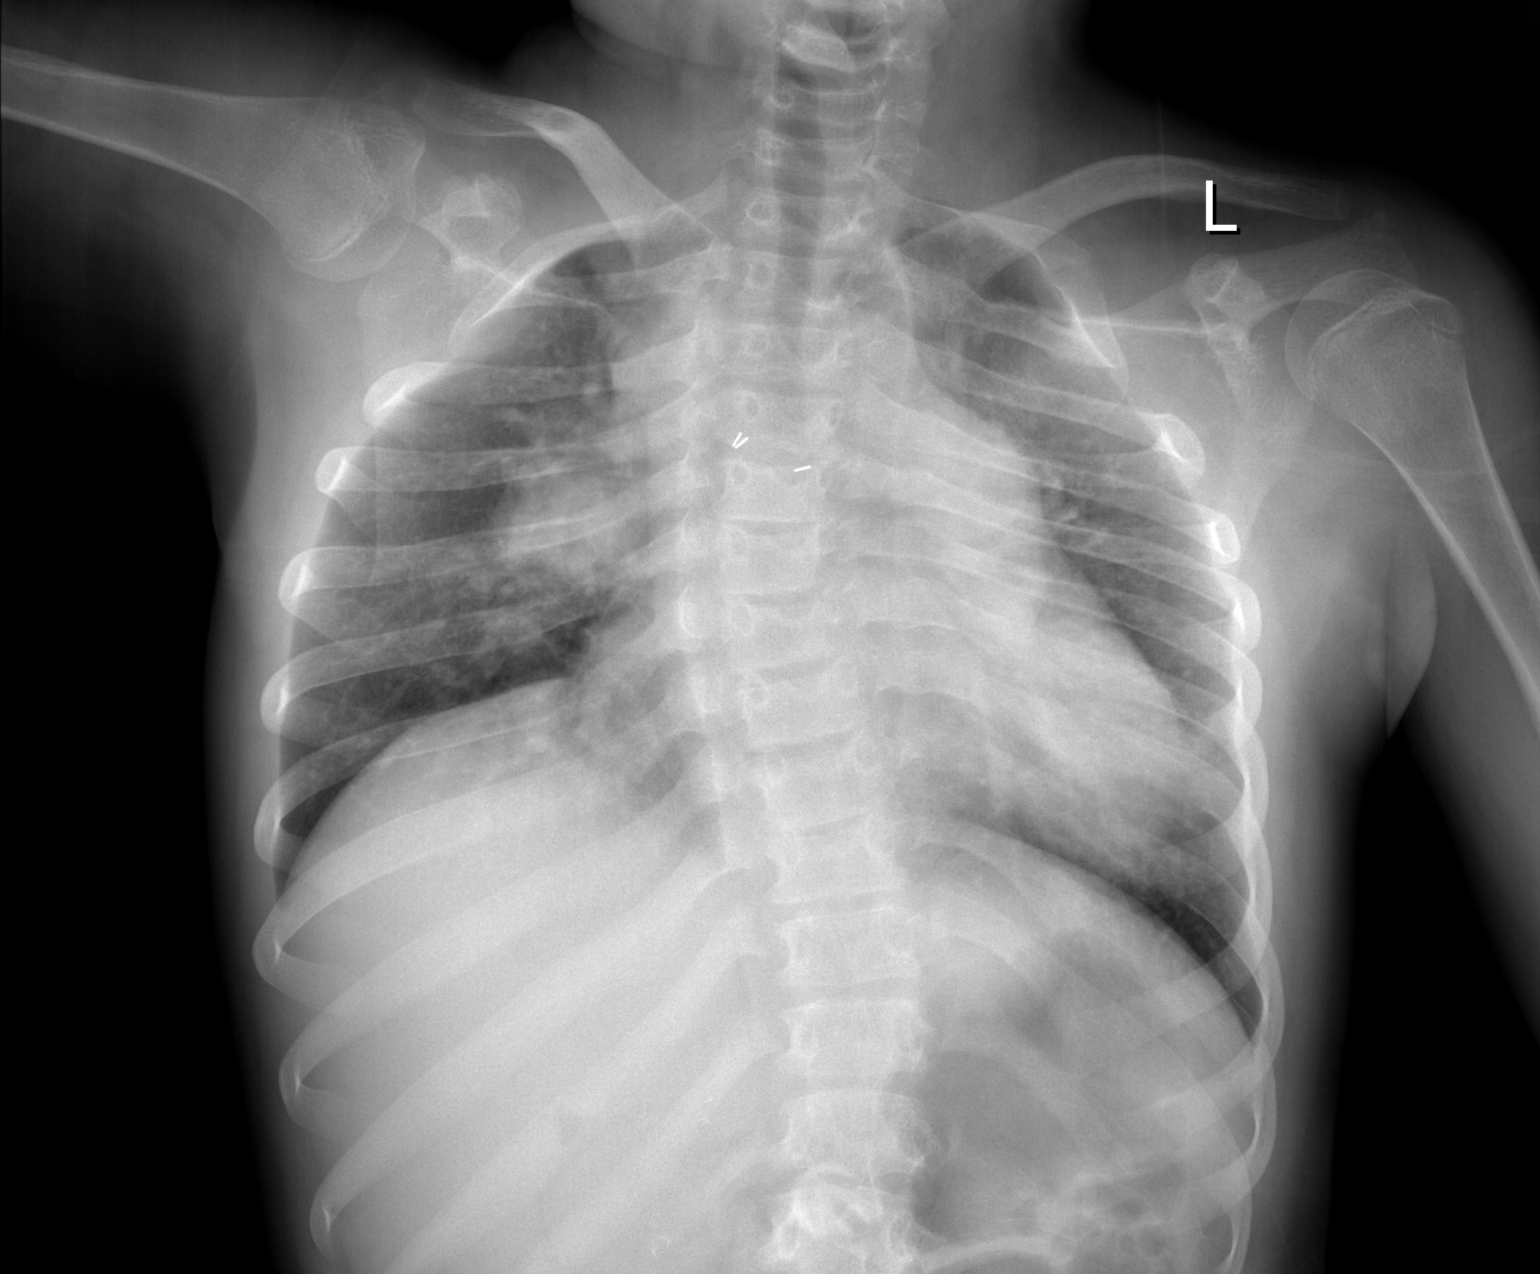

[1 of 1 positions shown; findings below may reference images not displayed]

FINDINGS: Surgical clips in the mediastinum.  Cardiac enlargement
with prominence of central pulmonary arteries bilaterally.  Shallow
inspiration with elevation of the right hemidiaphragm.  No focal
airspace consolidation.  Hazy perihilar appearance suggests mild
edema.  No blunting of costophrenic angles.  No pneumothorax.
IMPRESSION: Cardiac enlargement with pulmonary arterial enlargement, stable.
Shallow inspiration with elevation of right hemidiaphragm.
Possible perihilar edema.

## 2015-04-08 ENCOUNTER — Emergency Department (HOSPITAL_COMMUNITY)
Admission: EM | Admit: 2015-04-08 | Discharge: 2015-04-08 | Disposition: A | Discharge status: Home / Self Care | Payer: 59 | Attending: Emergency Medicine | Admitting: Emergency Medicine

## 2015-04-08 ENCOUNTER — Encounter (HOSPITAL_COMMUNITY): Payer: Self-pay | Admitting: Vascular Surgery

## 2015-04-08 DIAGNOSIS — Z792 Long term (current) use of antibiotics: Secondary | ICD-10-CM | POA: Insufficient documentation

## 2015-04-08 DIAGNOSIS — Z9889 Other specified postprocedural states: Secondary | ICD-10-CM | POA: Insufficient documentation

## 2015-04-08 DIAGNOSIS — Z87720 Personal history of (corrected) congenital malformations of eye: Secondary | ICD-10-CM | POA: Diagnosis not present

## 2015-04-08 DIAGNOSIS — R011 Cardiac murmur, unspecified: Secondary | ICD-10-CM | POA: Insufficient documentation

## 2015-04-08 DIAGNOSIS — K219 Gastro-esophageal reflux disease without esophagitis: Secondary | ICD-10-CM | POA: Diagnosis not present

## 2015-04-08 DIAGNOSIS — Z8709 Personal history of other diseases of the respiratory system: Secondary | ICD-10-CM | POA: Diagnosis not present

## 2015-04-08 DIAGNOSIS — Q211 Atrial septal defect: Secondary | ICD-10-CM | POA: Insufficient documentation

## 2015-04-08 DIAGNOSIS — R5383 Other fatigue: Secondary | ICD-10-CM | POA: Diagnosis present

## 2015-04-08 DIAGNOSIS — Z79899 Other long term (current) drug therapy: Secondary | ICD-10-CM | POA: Diagnosis not present

## 2015-04-08 LAB — COMPREHENSIVE METABOLIC PANEL
ALK PHOS: 84 U/L (ref 38–126)
ALT: 12 U/L — AB (ref 17–63)
AST: 18 U/L (ref 15–41)
Albumin: 3.9 g/dL (ref 3.5–5.0)
Anion gap: 10 (ref 5–15)
BUN: 9 mg/dL (ref 6–20)
CALCIUM: 9.3 mg/dL (ref 8.9–10.3)
CHLORIDE: 98 mmol/L — AB (ref 101–111)
CO2: 28 mmol/L (ref 22–32)
CREATININE: 0.64 mg/dL (ref 0.61–1.24)
Glucose, Bld: 109 mg/dL — ABNORMAL HIGH (ref 65–99)
Potassium: 4 mmol/L (ref 3.5–5.1)
Sodium: 136 mmol/L (ref 135–145)
Total Bilirubin: 0.6 mg/dL (ref 0.3–1.2)
Total Protein: 7.3 g/dL (ref 6.5–8.1)

## 2015-04-08 LAB — CBC WITH DIFFERENTIAL/PLATELET
BASOS PCT: 1 %
Basophils Absolute: 0.1 10*3/uL (ref 0.0–0.1)
EOS ABS: 0.2 10*3/uL (ref 0.0–0.7)
EOS PCT: 2 %
HCT: 39.9 % (ref 39.0–52.0)
Hemoglobin: 13 g/dL (ref 13.0–17.0)
LYMPHS ABS: 2.2 10*3/uL (ref 0.7–4.0)
Lymphocytes Relative: 19 %
MCH: 27.5 pg (ref 26.0–34.0)
MCHC: 32.6 g/dL (ref 30.0–36.0)
MCV: 84.5 fL (ref 78.0–100.0)
MONOS PCT: 6 %
Monocytes Absolute: 0.7 10*3/uL (ref 0.1–1.0)
NEUTROS PCT: 72 %
Neutro Abs: 8.3 10*3/uL — ABNORMAL HIGH (ref 1.7–7.7)
PLATELETS: 394 10*3/uL (ref 150–400)
RBC: 4.72 MIL/uL (ref 4.22–5.81)
RDW: 12.7 % (ref 11.5–15.5)
WBC: 11.4 10*3/uL — AB (ref 4.0–10.5)

## 2015-04-08 LAB — URINE MICROSCOPIC-ADD ON

## 2015-04-08 LAB — URINALYSIS, ROUTINE W REFLEX MICROSCOPIC
Bilirubin Urine: NEGATIVE
Glucose, UA: NEGATIVE mg/dL
Ketones, ur: NEGATIVE mg/dL
Leukocytes, UA: NEGATIVE
NITRITE: NEGATIVE
PROTEIN: NEGATIVE mg/dL
Specific Gravity, Urine: 1.01 (ref 1.005–1.030)
pH: 7 (ref 5.0–8.0)

## 2015-04-08 NOTE — ED Notes (Signed)
bp cuff and pulse ox monitor placed but pt has self hitting behavior so arm braces replaced for his safety.

## 2015-04-08 NOTE — ED Notes (Signed)
Pt noted as hitting self with left arm. Mom states arm braces and mittens in place. Lower extremitieswith legs crossed.

## 2015-04-08 NOTE — Discharge Instructions (Signed)
Blood work and urinalysis showed no significant abnormalities

## 2015-04-08 NOTE — ED Notes (Signed)
Mom states pt has been lethargic for 6 days. Mom states this is unusual. Last bm on Sunday. Hx of chronic constipation. Mom  States his breath "smell funny"

## 2015-04-08 NOTE — ED Provider Notes (Signed)
CSN: [id number]     Arrival date & time 04/08/15  1319 History   First MD Initiated Contact with Patient 04/08/15 1650     Chief Complaint  Patient presents with  . Fatigue     (Consider location/radiation/quality/duration/timing/severity/associated sxs/prior Treatment) HPI Comments: The patient is a 29 year old male, he has a history of Cornelia de Lange syndrome, remarkably he is 29 years old and has done fairly well over the years. He is nonambulatory, he has no vision, he usually is unable to eat by himself, he takes formula from a bottle as well as some soft baby foods. Over the past week or so his mother has states that he has not been himself, he has had increased mild agitation that manifests as hitting his head, this is not unusual for this syndrome. He also has been seeming more tired than usual and not as active as usual. She was told by the pediatrician to come to the ER for evaluation at this time. There has been no fevers, coughing, vomiting, diarrhea in fact he steeps A chronic constipation related to his underlying disease, takes daily lactulose for that. The patient is unable to give any information and is nonverbal.  The history is provided by the patient.    Past Medical History  Diagnosis Date  . Heart murmur   . Tetralogy of Fallot s/p repair     1991  [facility]  . Recurrent upper respiratory infection (URI)     sinus   . Blood transfusion     mom not sure when however   . GERD (gastroesophageal reflux disease)   . Cornelia de Lange syndrome   . Difficult intubation     SMALL  AIRWAY  . PFO (patent foramen ovale)     predominantly left to right shunt by '05 echo   Past Surgical History  Procedure Laterality Date  . Gastrostomy tube placement      1989  . Tetralogy of fallot repair      1991  . Hernia repair      BILATERAL  INGUINAL  . Joint replacement      BIL  HIP   FOR   DISLOCATION  92 & 94  . Duodenoal by pass      2001  . Appendectomy     . Eye surgery    . Meckel diverticulum excision    . Tooth extraction  06/18/2011    Procedure: DENTAL RESTORATION/EXTRACTIONS;  Surgeon: Eldridge AbrahamsMarc Elliot Goldenberg, DDS;  [facility];  Service: Oral Surgery;  Laterality: N/A;  Oral examination. Four fillings. Two extractions. Oral prophylaxis cleaning. Five x-rays.  . Eye examination under anesthesia  06/18/2011    Procedure: EYE EXAM UNDER ANESTHESIA;  Surgeon: Edmon CrapeGary A Rankin, MD;  [facility];  Service: Ophthalmology;  Laterality: Bilateral;   No family history on file. Social History  Substance Use Topics  . Smoking status: Never Smoker   . Smokeless tobacco: None  . Alcohol Use: No    Review of Systems  Unable to perform ROS: Patient nonverbal      Allergies  Milk-related compounds; Reglan; and Sulfa antibiotics  Home Medications   Prior to Admission medications   Medication Sig Start Date End Date Taking? Authorizing Provider  alum & mag hydroxide-simeth (ANTACID) 200-200-20 MG/5ML suspension Take 10 mLs by mouth 4 (four) times daily -  before meals and at bedtime. For acid relief   Yes Historical Provider, MD  bethanechol (URECHOLINE) 1 mg/mL SUSP Take 1.6  mg by mouth 3 (three) times daily.   Yes Historical Provider, MD  Cetirizine HCl (ZYRTEC) 5 MG/5ML SYRP Take 5 mg by mouth 2 (two) times daily.   Yes Historical Provider, MD  esomeprazole (NEXIUM) 20 MG capsule Take 40 mg by mouth 2 (two) times daily.    Yes Historical Provider, MD  lactulose (CHRONULAC) 10 GM/15ML solution Take 10 g by mouth 2 (two) times daily.   Yes Historical Provider, MD  nystatin ointment (MYCOSTATIN) Apply 1 application topically daily as needed. For yeast   Yes Historical Provider, MD  clonazePAM (KLONOPIN) 0.1 mg/mL SUSP Take 0.8 mg by mouth at bedtime as needed. For sleep    Historical Provider, MD  retapamulin (ALTABAX) 1 % ointment Apply topically 2 (two) times daily.    Historical Provider, MD   BP 140/90 mmHg  Pulse 97  Temp(Src) 98.8 F  (37.1 C) (Temporal)  Resp 22  SpO2 95% Physical Exam  Constitutional: No distress.  Severe growth retardation, microcephaly  HENT:  Head: Normocephalic and atraumatic.  Mouth/Throat: Oropharynx is clear and moist. No oropharyngeal exudate.  Small contusion to the left forehead where he continues to strike himself with his left arm.  MM appear normal but pt refuses to open mouth - no tongue biting  Eyes: Conjunctivae and EOM are normal. Pupils are equal, round, and reactive to light. Right eye exhibits no discharge. Left eye exhibits no discharge. No scleral icterus.  Keeps his eyes closed, no matting or discharge from the eyes  Neck: Normal range of motion. Neck supple. No JVD present. No thyromegaly present.  Cardiovascular: Normal rate, regular rhythm and intact distal pulses.  Exam reveals no gallop and no friction rub.   Murmur ( s/p repair of tetralogy of fallot - systolic murmur heard.) heard. Pulmonary/Chest: Effort normal and breath sounds normal. No respiratory distress. He has no wheezes. He has no rales.  Abdominal: Soft. Bowel sounds are normal. He exhibits no distension and no mass. There is no tenderness.  Musculoskeletal: Normal range of motion. He exhibits no edema or tenderness.  Lymphadenopathy:    He has no cervical adenopathy.  Neurological: He is alert. Coordination normal.  Non verbal - hitting self with L arm - does not follow commands  Skin: Skin is warm and dry. No rash noted. No erythema.  Psychiatric: He has a normal mood and affect. His behavior is normal.  Nursing note and vitals reviewed.   ED Course  Procedures (including critical care time) Labs Review Labs Reviewed  COMPREHENSIVE METABOLIC PANEL - Abnormal; Notable for the following:    Chloride 98 (*)    Glucose, Bld 109 (*)    ALT 12 (*)    All other components within normal limits  CBC WITH DIFFERENTIAL/PLATELET - Abnormal; Notable for the following:    WBC 11.4 (*)    Neutro Abs 8.3 (*)     All other components within normal limits  URINALYSIS, ROUTINE W REFLEX MICROSCOPIC (NOT AT [facility]) - Abnormal; Notable for the following:    Hgb urine dipstick MODERATE (*)    All other components within normal limits  URINE MICROSCOPIC-ADD ON - Abnormal; Notable for the following:    Squamous Epithelial / LPF 0-5 (*)    Bacteria, UA FEW (*)    All other components within normal limits  URINE CULTURE    Imaging Review No results found. I have personally reviewed and evaluated these images and lab results as part of my medical decision-making.  MDM   Final diagnoses:  Other fatigue    Pt appears at baseline according to mother at this time - will initiate a w/u for AMS with labs including CBC, CMP and UA - VS normal otherwise.  Mother in agreement with plan.    Mother informed of results - VS normal - labs normal stable for d/c.  Eber Hong, MD 04/08/15 2039

## 2015-04-08 NOTE — ED Notes (Addendum)
Pt reports to the ED for eval of increased lethargy x 6 days. Normally he is it more aware and will wake up and play however he has he has just been sleeping all the time. He has also had decreased PO intake. Pt has significant medical hx. Pt has not had a BM in 2 days. Pt resting until he is touched and then he becomes aggravated. Pt has still been having wet diapers. Spoke with PCP and was referred here. Resp e/u and skin warm and dry

## 2015-04-09 LAB — URINE CULTURE
CULTURE: NO GROWTH
SPECIAL REQUESTS: NORMAL

## 2016-08-04 ENCOUNTER — Ambulatory Visit: Payer: 59 | Attending: Pediatrics | Admitting: Physical Therapy

## 2016-08-04 DIAGNOSIS — R29818 Other symptoms and signs involving the nervous system: Secondary | ICD-10-CM

## 2016-08-04 DIAGNOSIS — R2689 Other abnormalities of gait and mobility: Secondary | ICD-10-CM | POA: Insufficient documentation

## 2016-08-05 NOTE — Therapy (Signed)
[facility] [address] [place], [place], [address] Phone: [phone]   Fax:  [phone]  Physical Therapy Evaluation  Patient Details  Name: Justin Watson MRN: [id number] Date of Birth: 1985-05-17 Referring Provider: Dr. Chales Salmon  Encounter Date: 08/04/2016      PT End of Session - 08/05/16 1602    Visit Number 1   Authorization Type UHC/Medicaid   PT Start Time 1315   PT Stop Time 1424   PT Time Calculation (min) 69 min      Past Medical History:  Diagnosis Date  . Blood transfusion    mom not sure when however   . Cornelia de Lange syndrome   . Difficult intubation    SMALL  AIRWAY  . GERD (gastroesophageal reflux disease)   . Heart murmur   . PFO (patent foramen ovale)    predominantly left to right shunt by '05 echo  . Recurrent upper respiratory infection (URI)    sinus   . Tetralogy of Fallot s/p repair    1991  [facility]    Past Surgical History:  Procedure Laterality Date  . APPENDECTOMY    . DUODENOAL BY PASS     2001  . EYE EXAMINATION UNDER ANESTHESIA  06/18/2011   Procedure: EYE EXAM UNDER ANESTHESIA;  Surgeon: Edmon Crape, MD;  Location: [facility] OR;  Service: Ophthalmology;  Laterality: Bilateral;  . EYE SURGERY    . GASTROSTOMY TUBE PLACEMENT     1989  . HERNIA REPAIR     BILATERAL  INGUINAL  . JOINT REPLACEMENT     BIL  HIP   FOR   DISLOCATION  92 & 94  . MECKEL DIVERTICULUM EXCISION    . TETRALOGY OF FALLOT REPAIR     1991  . TOOTH EXTRACTION  06/18/2011   Procedure: DENTAL RESTORATION/EXTRACTIONS;  Surgeon: Eldridge Abrahams, DDS;  Location[facility] OR;  Service: Oral Surgery;  Laterality: N/A;  Oral examination. Four fillings. Two extractions. Oral prophylaxis cleaning. Five x-rays.    There were no vitals filed for this visit.       Subjective Assessment - 08/05/16 1548    Subjective Pt presents to PT eval in a stroller - he is nonverbal and unable to see;  accompanied by his mother and caregiver; mother states they are requesting new stroller, bath chair and activity chair   Patient is accompained by: Family member   Pertinent History Cornelia Dentist syndrome - nonverbal and visual deficits; pt hits himself with his LUE - wears protective splnts on both arms to prevent elbow flexion and hands are covered for protection   Patient Stated Goals obtain equipment per mother's report    Currently in Pain? No/denies            [facility] PT Assessment - 08/05/16 0001      Assessment   Medical Diagnosis Cornelia Magnus Sinning syndrome   Referring Provider Dr. Chales Salmon   Onset Date/Surgical Date --  congenital     Precautions   Precautions Other (comment)  nonverbal, blind:                LMN for stroller, activity chair and bath chair to be completed after quote received from vendor [employer], [employer] from [employer]                          Plan - 08/05/16 1603    Clinical Impression  Statement Pt is a 31 yr old male with Cornelia De Lange syndrome; he is nonverbal and is unable to see and is dependent for all mobility and ADL's.  Pt presents to PT in a stroller; he is accompanied by his mother and his PCA.  Mother is requesting equipment including stroller, activity chair and bath chair.     PT Frequency One time visit  Eval only   PT Duration Other (comment)   PT Treatment/Interventions Patient/family education;ADLs/Self Care Home Management   Consulted and Agree with Plan of Care Family member/caregiver   Family Member Consulted mother and PCA      Patient will benefit from skilled therapeutic intervention in order to improve the following deficits and impairments:  Decreased cognition, Decreased mobility, Impaired vision/preception, Decreased safety awareness, Decreased strength  Visit Diagnosis: Other symptoms and signs involving the nervous system - Plan: PT plan of care cert/re-cert  Other abnormalities of  gait and mobility - Plan: PT plan of care cert/re-cert     Problem List There are no active problems to display for this patient.   Kary Kos, PT 08/05/2016, 4:14 PM  [facility] [address] [place], [place], [address] Phone: [phone]   Fax:  [phone]  Name: Justin Watson MRN: [id number] Date of Birth: 27-Jun-1985

## 2019-02-23 ENCOUNTER — Ambulatory Visit: Payer: Self-pay | Admitting: Oral Surgery

## 2019-02-23 ENCOUNTER — Encounter (HOSPITAL_COMMUNITY): Payer: Self-pay

## 2019-02-23 NOTE — Progress Notes (Signed)
[address] [place], [place] - [address] [place] [address] Phone: [phone] Fax: [phone]  [facility] [place], [place] - [place] [address] [place] [address] Phone: [phone] Fax: [phone]      Your procedure is scheduled on November 18  Report to [facility] Main Entrance "A" at 0630 A.M., and check in at the Admitting office.  Call this number if you have problems the morning of surgery:  [phone]  Call [phone] if you have any questions prior to your surgery date Monday-Friday 8am-4pm    Remember:  Do not eat or drink after midnight the night before your surgery    Take these medicines the morning of surgery with A SIP OF WATER  esomeprazole (NEXIUM   7 days prior to surgery STOP taking any Aspirin (unless otherwise instructed by your surgeon), Aleve, Naproxen, Ibuprofen, Motrin, Advil, Goody's, BC's, all herbal medications, fish oil, and all vitamins.    The Morning of Surgery  Do not wear jewelry  Do not wear lotions, powders, or colognes, or deodorant  Men may shave face and neck.  Do not bring valuables to the hospital.  [facility] is not responsible for any belongings or valuables.  If you are a smoker, DO NOT Smoke 24 hours prior to surgery IF you wear a CPAP at night please bring your mask, tubing, and machine the morning of surgery   Remember that you must have someone to transport you home after your surgery, and remain with you for 24 hours if you are discharged the same day.   Contacts, glasses, hearing aids, dentures or bridgework may not be worn into surgery.    Leave your suitcase in the car.  After surgery it may be brought to your room.  For patients admitted to the hospital, discharge time will be determined by your treatment team.  Patients discharged the day of surgery will not be allowed to drive home.    Special instructions:   [facility]- Preparing For Surgery  Before surgery, you can play an important role. Because skin is not sterile, your skin needs to be as free of germs as possible. You can reduce the number of germs on your skin by washing with CHG (chlorahexidine gluconate) Soap before surgery.  CHG is an antiseptic cleaner which kills germs and bonds with the skin to continue killing germs even after washing.    Oral Hygiene is also important to reduce your risk of infection.  Remember - BRUSH YOUR TEETH THE MORNING OF SURGERY WITH YOUR REGULAR TOOTHPASTE  Please do not use if you have an allergy to CHG or antibacterial soaps. If your skin becomes reddened/irritated stop using the CHG.  Do not shave (including legs and underarms) for at least 48 hours prior to first CHG shower. It is OK to shave your face.  Please follow these instructions carefully.   1. Shower the NIGHT BEFORE SURGERY and the MORNING OF SURGERY with CHG Soap.   2. If you chose to wash your hair, wash your hair first as usual with your normal shampoo.  3. After you shampoo, rinse your hair and body thoroughly to remove the shampoo.  4. Use CHG as you would any other liquid soap. You can apply CHG directly to the skin and wash gently with a scrungie or a clean washcloth.   5. Apply the CHG Soap to your body ONLY FROM THE NECK  DOWN.  Do not use on open wounds or open sores. Avoid contact with your eyes, ears, mouth and genitals (private parts). Wash Face and genitals (private parts)  with your normal soap.   6. Wash thoroughly, paying special attention to the area where your surgery will be performed.  7. Thoroughly rinse your body with warm water from the neck down.  8. DO NOT shower/wash with your normal soap after using and rinsing off the CHG Soap.  9. Pat yourself dry with a CLEAN TOWEL.  10. Wear CLEAN PAJAMAS to bed the night before surgery, wear comfortable clothes the morning of surgery  11. Place CLEAN SHEETS on your bed the  night of your first shower and DO NOT SLEEP WITH PETS.    Day of Surgery:  Do not apply any deodorants/lotions. Please shower the morning of surgery with the CHG soap  Please wear clean clothes to the hospital/surgery center.   Remember to brush your teeth WITH YOUR REGULAR TOOTHPASTE.   Please read over the following fact sheets that you were given.

## 2019-02-26 ENCOUNTER — Inpatient Hospital Stay (HOSPITAL_COMMUNITY)
Admission: RE | Admit: 2019-02-26 | Discharge: 2019-02-26 | Disposition: A | Discharge status: Home / Self Care | Payer: 59 | Source: Ambulatory Visit

## 2019-02-26 ENCOUNTER — Inpatient Hospital Stay (HOSPITAL_COMMUNITY): Admission: RE | Admit: 2019-02-26 | Payer: 59 | Source: Ambulatory Visit

## 2019-02-27 ENCOUNTER — Other Ambulatory Visit (HOSPITAL_COMMUNITY): Payer: 59

## 2019-02-28 ENCOUNTER — Ambulatory Visit (HOSPITAL_COMMUNITY): Admission: RE | Admit: 2019-02-28 | Payer: Medicare Other | Source: Home / Self Care | Admitting: Oral Surgery

## 2019-02-28 ENCOUNTER — Encounter (HOSPITAL_COMMUNITY): Admission: RE | Payer: Self-pay | Source: Home / Self Care

## 2019-02-28 SURGERY — DENTAL RESTORATION/EXTRACTION WITH X-RAY
Anesthesia: General

## 2019-03-05 ENCOUNTER — Ambulatory Visit: Payer: Self-pay | Admitting: Oral Surgery

## 2019-03-05 NOTE — Progress Notes (Signed)
[place], [place] [address] Phone: [phone] Fax: [phone]  [place], [place] [address] Phone: [phone] Fax: [phone]      Your procedure is scheduled on Wednesday, December 2nd, 2020.   Report to [facility] Main Entrance "A" at 12:00 P.M., and check in at the Admitting office.   Call this number if you have problems the morning of surgery:  [phone]  Call [phone] if you have any questions prior to your surgery date Monday-Friday 8am-4pm    Remember:  Do not eat or drink after midnight the night before your surgery    Take these medicines the morning of surgery with A SIP OF WATER :  Esomeprazole (Nexium) Loratadine (Claritin)  7 days prior to surgery STOP taking any Aspirin (unless otherwise instructed by your surgeon), Aleve, Naproxen, Ibuprofen, Motrin, Advil, Goody's, BC's, all herbal medications, fish oil, and all vitamins.    The Morning of Surgery  Do not wear jewelry, make-up or nail polish.  Do not wear lotions, powders, or perfumes/colognes, or deodorant  Do not shave 48 hours prior to surgery.  Men may shave face and neck.  Do not bring valuables to the hospital.  [facility] is not responsible for any belongings or valuables.  If you are a smoker, DO NOT Smoke 24 hours prior to surgery  If you wear a CPAP at night please bring your mask, tubing, and machine the morning of surgery   Remember that you must have someone to transport you home after your surgery, and remain with you for 24 hours if you are discharged the same day.   Please bring cases for contacts, glasses, hearing aids, dentures or bridgework because it cannot be worn into surgery.    Leave your suitcase in the car.  After surgery it may be brought to your room.  For patients admitted to the hospital, discharge time  will be determined by your treatment team.  Patients discharged the day of surgery will not be allowed to drive home.    Special instructions:   [facility]- Preparing For Surgery  Before surgery, you can play an important role. Because skin is not sterile, your skin needs to be as free of germs as possible. You can reduce the number of germs on your skin by washing with CHG (chlorahexidine gluconate) Soap before surgery.  CHG is an antiseptic cleaner which kills germs and bonds with the skin to continue killing germs even after washing.    Oral Hygiene is also important to reduce your risk of infection.  Remember - BRUSH YOUR TEETH THE MORNING OF SURGERY WITH YOUR REGULAR TOOTHPASTE  Please do not use if you have an allergy to CHG or antibacterial soaps. If your skin becomes reddened/irritated stop using the CHG.  Do not shave (including legs and underarms) for at least 48 hours prior to first CHG shower. It is OK to shave your face.  Please follow these instructions carefully.   1. Shower the NIGHT BEFORE SURGERY and the MORNING OF SURGERY with CHG Soap.   2. If you chose to wash your hair, wash your hair first as usual with your normal shampoo.  3. After you shampoo, rinse your hair and body thoroughly to remove the shampoo.  4. Use CHG as you would any other liquid soap. You can apply CHG directly to the  skin and wash gently with a scrungie or a clean washcloth.   5. Apply the CHG Soap to your body ONLY FROM THE NECK DOWN.  Do not use on open wounds or open sores. Avoid contact with your eyes, ears, mouth and genitals (private parts). Wash Face and genitals (private parts)  with your normal soap.   6. Wash thoroughly, paying special attention to the area where your surgery will be performed.  7. Thoroughly rinse your body with warm water from the neck down.  8. DO NOT shower/wash with your normal soap after using and rinsing off the CHG Soap.  9. Pat yourself dry with a CLEAN  TOWEL.  10. Wear CLEAN PAJAMAS to bed the night before surgery, wear comfortable clothes the morning of surgery  11. Place CLEAN SHEETS on your bed the night of your first shower and DO NOT SLEEP WITH PETS.    Day of Surgery:  Please shower the morning of surgery with the CHG soap Do not apply any deodorants/lotions. Please wear clean clothes to the hospital/surgery center.   Remember to brush your teeth WITH YOUR REGULAR TOOTHPASTE.   Please read over the following fact sheets that you were given.

## 2019-03-05 NOTE — Progress Notes (Addendum)
Anesthesia PAT Evaluation:   Case: [id number] Date/Time: 03/14/19 1345   Procedure: DENTAL RESTORATION/EXTRACTION WITH X-RAY (N/A )   Anesthesia type: General   Pre-op diagnosis: ABCESSED TEETH, CARIES   Location: [facility] OR ROOM 08 / [place] [facility]   Surgeon: Michael Litter, DMD      DISCUSSION: Patient is a 33 year old male (but small in stature, reported weight of 50 lbs and height of _0 ) scheduled for dental restoration on 03/14/2019 by Michael Litter, DMD.  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 He is a DNR.  Anesthesia Records (regarding airway history): 05/08/14 [facility] Pre O2/Mask induction: Preoxygenated with 100% O2 by face mask; Mask ventilation: easy; Size: 5.5; ETT Type: Oral; Cuffed: Cuffed; Air leak @: 20 cm H2O; Insertion attempts: 1; View: I; Blade: Mil 1.5; Type of view: direct laryngoscopy; Placement Assessment: Equal Bilateral Breath Sounds, Positive ETCO2; Airway Assistance: Anterior Laryngeal Pressure; Intubation Trauma: Atraumatic   06/18/11 [facility] Method: Circle system utilized Preoxygenation: Pre-oxygenation with 100% oxygen Intubation Type: Combination inhalational/ intravenous induction Ventilation: Mask ventilation without difficulty and Oral airway inserted - appropriate to patient size Laryngoscope Size: Mac and 3 Grade View: Grade I Tube type: Oral Tube size: 4.5 mm Number of attempts: 4 Airway Equipment and Method: Stylet Placement Confirmation: ETT inserted through vocal cords under direct vision,  positive ETCO2 and breath sounds checked- equal and bilateral Tube secured with: Tape Dental Injury: Bloody posterior oropharynx  Difficulty Due To: Difficulty was anticipated Comments:  DVL x 1 with MAC 2 for nasal intubation. Nasal ETT passed easily thru left nares with red rubber. 4.5 Nasal Raeon first attempt.  Unable to pass thru cords.  Switched to Glidescope to share view.  Unable to pass ETT thru cords.  Ett switched to 4.0 Nasal Dwyane Luo - unable to pass thru cords.  Switched to 4.5 ETT with cuff.  Passed thru cords by Dr. Tamala Julian on 4th attempt.    He was last seen by pediatric cardiologist Dr. Filbert Schilder in 2013 (see below). Parents had opted not to pursue additional cardiac intervention, so no additional testing ordered, and essentially cardiology follow-up left up to the parents.   He was seen by his PCP Dr. Francesco Sor on 02/23/19 for preoperative evaluation. He recently was diagnosed with oral infection/abscess with facial swelling. Swelling improved with antibiotics, but Justin Watson still irritable and mother is concerned that once antibiotics stopped symptoms will worsen. He has GI upset with antibiotics, so she did not feel he would tolerate long term antibiotics and wanted to proceed with surgery for his comfort.  Dr. Francesco Sor writes, that "mother and father seem to have a deep understanding of Justin Watson's complexity and risk and despite the fairly high risk, of even possible death, Justin Watson, the mother wanted to press forward... She understands and agrees that preop evaluation through echocardiography and cardiology referral could result in better optimization and safety, but she states that this would not affect the outcome, she would still want the operation to occur" He would not give medical clearance but instead looked at the "clinical indication of palliation and comfort". He had further conversations with Dr. Mancel Parsons and also contacted cardiologist Dr. Einar Gip. Given scenario, Dr. Einar Gip advised keeping Justin Watson hydrated and avoiding hypotension with surgery.    Given complexity of the situation, case was  discussed with multiple anesthesiologists including Oren Bracket, MD and Oleta Mouse,  MD. Justin Watson felt to be potentially extremely high risk for surgery given his cardiac history (with unknown cardiac function), difficult airway, and general health. Given family's reluctance for cardiology evaluation or testing, Dr. Ola Spurr spoke with representative from the Ethics Committee to discuss options since anesthesiologist would prefer at minimum a preoperative echo to delineate his cardiac function and better define his surgical risk. If family still reluctant then could consider palliative care consult to define goals of care; however, after Dr. Ola Spurr evaluated Justin Watson and spoke with his mother Justin Watson, she was in agreement with proceeding with pre-operative echo. She is still concerned that echo findings may further discourage anesthesia and surgical teams in proceeding with the procedure. She reiterated that she understands that surgical risks includes even potential for death. She desires for Justin Watson to proceed with surgery, as he has been more agitated lately, and she is concerned he is in pain that that infection may progress without further treatment.    Dr. Ola Spurr spoke with Dr. Einar Gip who has agreed to read preoperative echocardiogram. Current plan is for [facility] to arrive at 7:30 AM on 03/14/19 for ~ 2:00 PM surgery. Bedside transthoracic echo ordered, and staff to contact Dr. Einar Gip on arrival. I have notified Rich with the Echo Department (including location) and also notified Justin Watson's mother Justin Watson to have him at [facility] at 7:30 AM for the echo. She is in agreement but is concerned that he may become agitated and restless while waiting for his afternoon surgery, so may require some medication. (In the past, he has been given Versed prior to surgery. Most often PIV and labs, if needed, are done after Versed and/or anesthesia administered.)     VS: Pulse 78   Temp (!) 36.1 C (Oral)   Ht _0  (1.194 m)   Wt 22.7 kg   BMI 15.91 kg/m   He is in a wheelchair for PAT. Arms are padded,  as he has frequent arm movement/flapping, as well as, moving back and forth. He is non-verbal. He has history of retinal detachments with very poor vision. Mother said he can hear, but doesn't really process to respond appropriately. He is small in stature. Small nares. I did not see inside his mouth. No peripheral edema. He drinks liquids through a bottle and eats puree consistency foods. He spends most of his time in a recliner. He can crawl, but not really walk. Mother says he will intermittently have nasal congestion.      PROVIDERS: Sueanne Margarita, DO is PCP at [facility] first established in 2020. Previously, he was seeing pediatrician Harrie Jeans, MD until health insurance change.    - He is not currently followed by cardiology, although plans for bedside echocardiogram on 03/14/19 which has been coordinated with Adrian Prows, MD with [facility]. Previously was seeing pediatric cardiologist Darrol Jump, MD with [facility] in [place] [phone]). Last visit 07/01/11. 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 He talked about long term prognosis and "probable progression of his right heart failure and signs of either bronchiole or airway compression due to massive right ventricular enlargement or progressive right heart failure due to volume overload with diminution of function" He discussed edema, GI intolerance and hepatomegaly as all possible signs, as well as arrhythmias related to RVH, including potential for fatal arrhythmia, and also risk for clotting or PE  due to poorly functioning dilator in RV. He discussed the only way to assess Blakes' CV function was with echocardiogram or MRI under anesthesia, but parents did not want to pursue additional cardiac intervention, so no further testing ordered at that time. He discussed potential for 2-3 year follow-up, but  would leave it up to them since not plans for further intervention at that point.     LABS: If desired by assigned anesthesiologist then would need to obtain on the day of surgery. He has required lab draws under sedation or anesthesia in the past.      IMAGES: Last CXR seen on 11/25/11 [facility]): FINDINGS: There is mild lumbar scoliosis convex to the left. The cardiac  silhouette shows enlargement with mild pulmonary congestion. The pulmonary  artery segments are markedly enlarged suggesting pulmonary arterial  hypertension. Surgical clips are seen in the anterior mediastinum. Petra Kuba of  the prior surgery is unknown.   EKG: Mother said EKG was done at [facility], but medical records could not locate an EKG  at their office, so if anesthesiologist desires an EKG then this will need to be done on the day of surgery.   EKG dated 27-Jan-1988 (Media tab, Correspondence) showed: ST at 107 bpm, left atrial enlargement, right BBB, T wave abnormality, consider lateral ischemia.    CV: Beside transthoracic echocardiogram arranged from 03/14/19.   Echo 08/20/03 ([place]) DOPPLER MEASUREMENTS Other Measurements  INTRACARDIAC     TR                       35 mmHg  CARDIAC DIAGNOSES (ECHO) - Dilated Right atrium - Atrial septum with patent foramen ovale -- predominantly left-to-right - Mild Tricuspid valve regurgitation - Severe Right ventricle dilatation - Left ventricle with mild-to-moderately decreased ventricular function - Conotruncal anomalies:  Tetralogy of Fallot -- S/P repair - Pulmonary valve insufficiency -- free -  Proximal great arteries: pulmonary artery trunk dilatation  POST-SURG/INTERV DIAGNOSES (ECHO) - Valvar insufficiency - pulmonic, recurrent -- free  SURGICAL PROCEDURES (ECHO) - Repair pulmonary outflow - Tetralogy of Fallot repair  COMMENT - Status post tetralogy of Fallot. - The inferior vena cava and abdominal aorta appear to be  on the right side of the spine. - Patent foramen ovale with predominantly left-to-right shunt. - Dilated right atrium. - Mild tricuspid regurgitation, no mitral regurgitation. - Right ventricular systolic pressure is at least 35 mmHg plus the right atrial pressure. - Left heart chambers are mildly compressed with normal LV wall thickness and mildly-to-moderately decreased left ventricular function. - Severely dilated right ventricle with normal wall thickness and grossly normal function. - No residual ventricular septal defect. - Dilated main pulmonary artery with free pulmonary regurgitation and no stenosis. - The right pulmonary artery appears normal in size.  The left pulmonary artery origin appears moderately stenotic.  There is a peak velocity of 2 m/s into each branch pulmonary artery. - Right aortic arch which appears unobstructed. - The coronary arteries, pulmonary veins, aortic arch and branching pattern, and the pulmonary artery anatomy are not well seen. - Image quality is poor with limited patient cooperation.     Past Medical History:  Diagnosis Date  . Blood transfusion    mom not sure when however   . Cornelia de Lange syndrome   . Difficult intubation    SMALL  AIRWAY  . GERD (gastroesophageal reflux disease)   . Heart murmur   . PFO (patent foramen ovale)  predominantly left to right shunt by '05 echo  . Recurrent upper respiratory infection (URI)    sinus   . Tetralogy of Fallot s/p repair    1991  [facility]    Past Surgical History:  Procedure Laterality Date  . APPENDECTOMY    . [place] BY PASS     2001  . EYE EXAMINATION UNDER ANESTHESIA  06/18/2011   Procedure: EYE EXAM UNDER ANESTHESIA;  Surgeon: Hurman Horn, MD;  Location: [place];  Service: Ophthalmology;  Laterality: Bilateral;  . EYE SURGERY    . GASTROSTOMY TUBE PLACEMENT     1989  . HERNIA REPAIR     BILATERAL  INGUINAL  . JOINT REPLACEMENT     BIL  HIP   FOR   DISLOCATION  92 & 94   . MECKEL DIVERTICULUM EXCISION    . TETRALOGY OF FALLOT REPAIR     1991  . TOOTH EXTRACTION  06/18/2011   Procedure: DENTAL RESTORATION/EXTRACTIONS;  Surgeon: Antonietta Breach, DDS;  Location: [place];  Service: Oral Surgery;  Laterality: N/A;  Oral examination. Four fillings. Two extractions. Oral prophylaxis cleaning. Five x-rays.    MEDICATIONS: . alum & mag hydroxide-simeth (ANTACID) 200-200-20 MG/5ML suspension  . amoxicillin-clavulanate (AUGMENTIN) 600-42.9 MG/5ML suspension  . bethanechol (URECHOLINE) 1 mg/mL SUSP  . chlorhexidine (PERIDEX) 0.12 % solution  . esomeprazole (NEXIUM) 40 MG capsule  . ibuprofen (ADVIL) 100 MG/5ML suspension  . lactulose (CHRONULAC) 10 GM/15ML solution  . loratadine (CLARITIN) 5 MG/5ML syrup   No current facility-administered medications for this encounter.      Myra Gianotti, PA-C Surgical Short Stay/Anesthesiology [facility] Phone [phone] [facility] Phone [phone] 03/06/2019 5:48 PM

## 2019-03-06 ENCOUNTER — Encounter (HOSPITAL_COMMUNITY): Payer: Self-pay

## 2019-03-06 ENCOUNTER — Encounter (HOSPITAL_COMMUNITY)
Admission: RE | Admit: 2019-03-06 | Discharge: 2019-03-06 | Disposition: A | Discharge status: Home / Self Care | Payer: Medicare Other | Source: Ambulatory Visit | Attending: Oral Surgery | Admitting: Oral Surgery

## 2019-03-06 ENCOUNTER — Other Ambulatory Visit: Payer: Self-pay | Admitting: Cardiology

## 2019-03-06 ENCOUNTER — Other Ambulatory Visit: Payer: Self-pay

## 2019-03-06 DIAGNOSIS — Z79899 Other long term (current) drug therapy: Secondary | ICD-10-CM | POA: Insufficient documentation

## 2019-03-06 DIAGNOSIS — Z01812 Encounter for preprocedural laboratory examination: Secondary | ICD-10-CM | POA: Diagnosis present

## 2019-03-06 DIAGNOSIS — I4510 Unspecified right bundle-branch block: Secondary | ICD-10-CM | POA: Diagnosis not present

## 2019-03-06 DIAGNOSIS — I071 Rheumatic tricuspid insufficiency: Secondary | ICD-10-CM | POA: Diagnosis not present

## 2019-03-06 DIAGNOSIS — Z66 Do not resuscitate: Secondary | ICD-10-CM | POA: Diagnosis not present

## 2019-03-06 DIAGNOSIS — Z791 Long term (current) use of non-steroidal anti-inflammatories (NSAID): Secondary | ICD-10-CM | POA: Diagnosis not present

## 2019-03-06 DIAGNOSIS — Z931 Gastrostomy status: Secondary | ICD-10-CM | POA: Diagnosis not present

## 2019-03-06 DIAGNOSIS — K219 Gastro-esophageal reflux disease without esophagitis: Secondary | ICD-10-CM | POA: Diagnosis not present

## 2019-03-06 DIAGNOSIS — K029 Dental caries, unspecified: Secondary | ICD-10-CM | POA: Insufficient documentation

## 2019-03-06 HISTORY — DX: Family history of other specified conditions: Z84.89

## 2019-03-06 NOTE — Progress Notes (Signed)
Scheduled for Covid test on Monday No h/o recent travel. No h/o symptoms concerning for Covid.  PCP - Dr Francesco Sor, [facility]  Cardiologist - denies  Pediatrician-Dr Dees,Janet  Chest x-ray -   EKG - 02-23-19  Stress Test - denies  ECHO - 2005  Cardiac Cath - denies  AICD-NA PM-NA LOOP-NA  Sleep Study -  CPAP -   LABS- will be done DOS  ASA-denies  ERAS-NA  HA1C-denies Fasting Blood Sugar -  Checks Blood Sugar _____ times a day  Anesthesia-Y Multiple comorbidities.  Pt denies having chest pain, sob, or fever at this time. All instructions explained to the pt, with a verbal understanding of the material. Pt agrees to go over the instructions while at home for a better understanding. Pt also instructed to self quarantine after being tested for COVID-19. The opportunity to ask questions was provided.

## 2019-03-06 NOTE — Anesthesia Preprocedure Evaluation (Addendum)
Anesthesia Evaluation  Patient identified by MRN, date of birth, ID band Patient confused    Reviewed: NPO status , Patient's Chart, lab work & pertinent test results, Unable to perform ROS - Chart review only  Airway Mallampati: II     Mouth opening: Limited Mouth Opening  Dental  (+) Teeth Intact, Poor Dentition   Pulmonary    breath sounds clear to auscultation       Cardiovascular  Rhythm:Regular Rate:Normal     Neuro/Psych    GI/Hepatic   Endo/Other    Renal/GU      Musculoskeletal   Abdominal   Peds  Hematology   Anesthesia Other Findings Patient with facial dysmorphic facial features. Unable to follow commands, 20 kg weight  Reproductive/Obstetrics                             Anesthesia Physical Anesthesia Plan  ASA: III  Anesthesia Plan: General   Post-op Pain Management:    Induction: Inhalational  PONV Risk Score and Plan: Ondansetron  Airway Management Planned: Oral ETT  Additional Equipment:   Intra-op Plan:   Post-operative Plan: Extubation in OR  Informed Consent: I have reviewed the patients History and Physical, chart, labs and discussed the procedure including the risks, benefits and alternatives for the proposed anesthesia with the patient or authorized representative who has indicated his/her understanding and acceptance.       Plan Discussed with: CRNA and Anesthesiologist  Anesthesia Plan Comments: (See PAT note written 03/06/2019 by Myra Gianotti, PA-C.  Cornelia de Lange syndrome, history of tetralogy of Fallot repair at age 33, difficult intubation (2013). 2013 and 2016 anesthesia records outlined in APP note.   [facility] to arrive at 7:30 AM for beside echo. Echo department aware and notified to contact Dr. Einar Gip on arrival.   No EKG received from [facility], so if preoperative EKG desired then let staff know. Has required Versed for PIV in the  past. Mother is concerned he will be become restless while waiting for afternoon surgery, and may require medication.  )      Anesthesia Quick Evaluation

## 2019-03-12 ENCOUNTER — Other Ambulatory Visit (HOSPITAL_COMMUNITY)
Admission: RE | Admit: 2019-03-12 | Discharge: 2019-03-12 | Disposition: A | Discharge status: Home / Self Care | Payer: Medicare Other | Source: Ambulatory Visit | Attending: Oral Surgery | Admitting: Oral Surgery

## 2019-03-12 DIAGNOSIS — Z20828 Contact with and (suspected) exposure to other viral communicable diseases: Secondary | ICD-10-CM | POA: Diagnosis not present

## 2019-03-12 DIAGNOSIS — Z01812 Encounter for preprocedural laboratory examination: Secondary | ICD-10-CM | POA: Insufficient documentation

## 2019-03-12 LAB — SARS CORONAVIRUS 2 (TAT 6-24 HRS): SARS Coronavirus 2: NEGATIVE

## 2019-03-14 ENCOUNTER — Ambulatory Visit (HOSPITAL_COMMUNITY): Payer: Medicare Other | Admitting: Anesthesiology

## 2019-03-14 ENCOUNTER — Ambulatory Visit (HOSPITAL_COMMUNITY)
Admission: RE | Admit: 2019-03-14 | Discharge: 2019-03-14 | Disposition: A | Discharge status: Home / Self Care | Payer: Medicare Other | Attending: Oral Surgery | Admitting: Oral Surgery

## 2019-03-14 ENCOUNTER — Encounter (HOSPITAL_COMMUNITY): Payer: Self-pay

## 2019-03-14 ENCOUNTER — Other Ambulatory Visit: Payer: Self-pay

## 2019-03-14 ENCOUNTER — Ambulatory Visit (HOSPITAL_COMMUNITY): Payer: Medicare Other | Admitting: Emergency Medicine

## 2019-03-14 ENCOUNTER — Encounter (HOSPITAL_COMMUNITY)
Admission: RE | Disposition: A | Discharge status: Home / Self Care | Payer: Self-pay | Source: Home / Self Care | Attending: Oral Surgery

## 2019-03-14 ENCOUNTER — Ambulatory Visit (HOSPITAL_COMMUNITY): Payer: Medicare Other

## 2019-03-14 DIAGNOSIS — Z79899 Other long term (current) drug therapy: Secondary | ICD-10-CM | POA: Insufficient documentation

## 2019-03-14 DIAGNOSIS — K047 Periapical abscess without sinus: Secondary | ICD-10-CM | POA: Diagnosis present

## 2019-03-14 DIAGNOSIS — Z882 Allergy status to sulfonamides status: Secondary | ICD-10-CM | POA: Diagnosis not present

## 2019-03-14 DIAGNOSIS — Z01810 Encounter for preprocedural cardiovascular examination: Secondary | ICD-10-CM

## 2019-03-14 DIAGNOSIS — K05319 Chronic periodontitis, localized, unspecified severity: Secondary | ICD-10-CM | POA: Insufficient documentation

## 2019-03-14 DIAGNOSIS — Q249 Congenital malformation of heart, unspecified: Secondary | ICD-10-CM | POA: Diagnosis not present

## 2019-03-14 DIAGNOSIS — Q8719 Other congenital malformation syndromes predominantly associated with short stature: Secondary | ICD-10-CM | POA: Diagnosis not present

## 2019-03-14 HISTORY — PX: DENTAL RESTORATION/EXTRACTION WITH X-RAY: SHX5796

## 2019-03-14 LAB — ECHOCARDIOGRAM LIMITED: Height: 47 in

## 2019-03-14 SURGERY — DENTAL RESTORATION/EXTRACTION WITH X-RAY
Anesthesia: General

## 2019-03-14 MED ORDER — SUGAMMADEX SODIUM 200 MG/2ML IV SOLN
INTRAVENOUS | Status: DC | PRN
  Administered 2019-03-14: 50 mg via INTRAVENOUS
  Administered 2019-03-14: 25 mg via INTRAVENOUS

## 2019-03-14 MED ORDER — BUPIVACAINE HCL (PF) 0.5 % IJ SOLN
INTRAMUSCULAR | Status: AC
  Filled 2019-03-14: qty 30

## 2019-03-14 MED ORDER — ONDANSETRON HCL 4 MG/2ML IJ SOLN
INTRAMUSCULAR | Status: DC | PRN
  Administered 2019-03-14: 2 mg via INTRAVENOUS

## 2019-03-14 MED ORDER — BUPIVACAINE-EPINEPHRINE (PF) 0.5% -1:200000 IJ SOLN
INTRAMUSCULAR | Status: AC
  Filled 2019-03-14: qty 7.2

## 2019-03-14 MED ORDER — ROCURONIUM BROMIDE 10 MG/ML (PF) SYRINGE
PREFILLED_SYRINGE | INTRAVENOUS | Status: AC
  Filled 2019-03-14: qty 10

## 2019-03-14 MED ORDER — FENTANYL CITRATE (PF) 250 MCG/5ML IJ SOLN
INTRAMUSCULAR | Status: AC
  Filled 2019-03-14: qty 5

## 2019-03-14 MED ORDER — MIDAZOLAM HCL 2 MG/ML PO SYRP
0.5000 mg/kg | ORAL_SOLUTION | Freq: Once | ORAL | Status: AC
  Administered 2019-03-14: 10.2 mg via ORAL
  Filled 2019-03-14: qty 6

## 2019-03-14 MED ORDER — ROCURONIUM BROMIDE 10 MG/ML (PF) SYRINGE
PREFILLED_SYRINGE | INTRAVENOUS | Status: DC | PRN
  Administered 2019-03-14: 12 mg via INTRAVENOUS

## 2019-03-14 MED ORDER — FENTANYL CITRATE (PF) 100 MCG/2ML IJ SOLN
INTRAMUSCULAR | Status: DC | PRN
  Administered 2019-03-14 (×2): 20 ug via INTRAVENOUS

## 2019-03-14 MED ORDER — MIDAZOLAM HCL 2 MG/ML PO SYRP
5.0000 mg | ORAL_SOLUTION | Freq: Once | ORAL | Status: AC
  Administered 2019-03-14: 11:00:00 5 mg via ORAL

## 2019-03-14 MED ORDER — LIDOCAINE-EPINEPHRINE 2 %-1:100000 IJ SOLN
INTRAMUSCULAR | Status: AC
  Filled 2019-03-14: qty 1

## 2019-03-14 MED ORDER — DEXMEDETOMIDINE HCL IN NACL 200 MCG/50ML IV SOLN
INTRAVENOUS | Status: DC | PRN
  Administered 2019-03-14 (×2): 4 ug via INTRAVENOUS

## 2019-03-14 MED ORDER — GELATIN ABSORBABLE 12-7 MM EX MISC
CUTANEOUS | Status: DC | PRN
  Administered 2019-03-14: 1

## 2019-03-14 MED ORDER — LIDOCAINE-EPINEPHRINE 1 %-1:100000 IJ SOLN
INTRAMUSCULAR | Status: AC
  Filled 2019-03-14: qty 1

## 2019-03-14 MED ORDER — LACTATED RINGERS IV SOLN
INTRAVENOUS | Status: DC | PRN
  Administered 2019-03-14: 14:00:00 via INTRAVENOUS

## 2019-03-14 MED ORDER — CHLORHEXIDINE GLUCONATE 0.12 % MT SOLN: 5.0000 mL | Freq: Two times a day (BID) | OROMUCOSAL | 0 refills | Status: DC

## 2019-03-14 MED ORDER — LIDOCAINE-EPINEPHRINE 2 %-1:100000 IJ SOLN
INTRAMUSCULAR | Status: AC
  Filled 2019-03-14: qty 13.6

## 2019-03-14 MED ORDER — LIDOCAINE-EPINEPHRINE 2 %-1:100000 IJ SOLN
INTRAMUSCULAR | Status: DC | PRN
  Administered 2019-03-14: 6 mL via INTRADERMAL

## 2019-03-14 MED ORDER — DEXAMETHASONE SODIUM PHOSPHATE 4 MG/ML IJ SOLN
INTRAMUSCULAR | Status: DC | PRN
  Administered 2019-03-14: 4 mg via INTRAVENOUS

## 2019-03-14 MED ORDER — AMOXICILLIN 250 MG/5ML PO SUSR: 500.0000 mg | Freq: Three times a day (TID) | ORAL | 0 refills | Status: DC

## 2019-03-14 MED ORDER — MIDAZOLAM HCL 2 MG/ML PO SYRP
ORAL_SOLUTION | ORAL | Status: AC
  Administered 2019-03-14: 5 mg via ORAL
  Filled 2019-03-14: qty 4

## 2019-03-14 SURGICAL SUPPLY — 44 items
BLADE SURG 15 STRL LF DISP TIS (BLADE) ×2 IMPLANT
BLADE SURG 15 STRL SS (BLADE) ×6
CANISTER SUCT 1200ML W/VALVE (MISCELLANEOUS) ×3 IMPLANT
COVER BACK TABLE 60X90IN (DRAPES) ×3 IMPLANT
COVER MAYO STAND STRL (DRAPES) ×3 IMPLANT
DRAPE U-SHAPE 76X120 STRL (DRAPES) ×2 IMPLANT
GAUZE PACKING FOLDED 2  STR (GAUZE/BANDAGES/DRESSINGS) ×2
GAUZE PACKING FOLDED 2 STR (GAUZE/BANDAGES/DRESSINGS) IMPLANT
GAUZE SPONGE 4X4 12PLY STRL (GAUZE/BANDAGES/DRESSINGS) ×2 IMPLANT
GAUZE SPONGE 4X4 16PLY XRAY LF (GAUZE/BANDAGES/DRESSINGS) ×2 IMPLANT
GLOVE BIO SURGEON STRL SZ 6.5 (GLOVE) ×1 IMPLANT
GLOVE BIO SURGEONS STRL SZ 6.5 (GLOVE) ×1
GLOVE BIOGEL PI IND STRL 6.5 (GLOVE) IMPLANT
GLOVE BIOGEL PI IND STRL 7.0 (GLOVE) IMPLANT
GLOVE BIOGEL PI INDICATOR 6.5 (GLOVE) ×2
GLOVE BIOGEL PI INDICATOR 7.0 (GLOVE)
GLOVE ORTHO TXT STRL SZ7.5 (GLOVE) ×3 IMPLANT
GOWN STRL REUS W/ TWL LRG LVL3 (GOWN DISPOSABLE) ×3 IMPLANT
GOWN STRL REUS W/TWL LRG LVL3 (GOWN DISPOSABLE) ×9
IV NS 500ML (IV SOLUTION) ×3
IV NS 500ML BAXH (IV SOLUTION) ×1 IMPLANT
KIT BASIN OR (CUSTOM PROCEDURE TRAY) ×3 IMPLANT
NDL DENTAL 27 LONG (NEEDLE) ×1 IMPLANT
NDL HYPO 18GX1.5 BLUNT FILL (NEEDLE) IMPLANT
NDL HYPO 25GX1X1/2 BEV (NEEDLE) IMPLANT
NEEDLE 22X1 1/2 (OR ONLY) (NEEDLE) ×3 IMPLANT
NEEDLE DENTAL 27 LONG (NEEDLE) ×3 IMPLANT
NEEDLE HYPO 18GX1.5 BLUNT FILL (NEEDLE) ×3 IMPLANT
NEEDLE HYPO 25GX1X1/2 BEV (NEEDLE) ×6 IMPLANT
NS IRRIG 1000ML POUR BTL (IV SOLUTION) ×3 IMPLANT
SPONGE SURGIFOAM ABS GEL 12-7 (HEMOSTASIS) ×2 IMPLANT
SUT CHROMIC 3 0 PS 2 (SUTURE) ×2 IMPLANT
SUT CHROMIC 4 0 PS 2 18 (SUTURE) ×3 IMPLANT
SUT SILK 3 0 PS 1 (SUTURE) IMPLANT
SUT VIC AB 4-0 P-3 18X BRD (SUTURE) ×1 IMPLANT
SUT VIC AB 4-0 P3 18 (SUTURE) ×6
SYR 50ML LL SCALE MARK (SYRINGE) ×3 IMPLANT
SYR CONTROL 10ML LL (SYRINGE) ×2 IMPLANT
TOWEL GREEN STERILE (TOWEL DISPOSABLE) ×3 IMPLANT
TOWEL OR NON WOVEN STRL DISP B (DISPOSABLE) IMPLANT
TUBE CONNECTING 20'X1/4 (TUBING) ×1
TUBE CONNECTING 20X1/4 (TUBING) ×2 IMPLANT
VENT IRR SPI W TUB AD (MISCELLANEOUS) ×3 IMPLANT
YANKAUER SUCT BULB TIP NO VENT (SUCTIONS) ×3 IMPLANT

## 2019-03-14 NOTE — Op Note (Signed)
03/14/2019  3:52 PM  PATIENT:  Justin Watson  33 y.o. male  PRE-OPERATIVE DIAGNOSIS:  ABCESSED TEETH, CARIES  POST-OPERATIVE DIAGNOSIS:  ABCESSED TEETH, CARIES  PROCEDURE:  Procedure(s): Exam under anesthesia; Extraction of  two teeth; Radiograph (N/A)  SURGEON:  Surgeon(s) and Role:    * Deirdre Gryder, DMD - Primary  ANESTHESIA:   general  EBL:  10 mL   COMPLICATIONS: none  Findings: abscess left anterior maxilla, no other carious lesions found  Indications for procedure: 33 y/o male with multiple medical problems, nonverbal, non-cooperative, who presents with history of maxillary abscess, presents for exam under anesthesia, radiographs, and extraction of indicated teeth.  Procedure: The patient was greeted in the preoperative holding by both anesthesia and the maxillofacial team. Health history was reviewed. Consent was verified. The patient was brought back to the operating room and placed in the table in the supine position. Standard ASA leads and monitors were placed. The patient was preoxygenated, and induced via inhalation methods. Intravenous access was obtained by anesthesia, and the patient was induced, and his airway was protected with an oral endotracheal tube. The tube was taped and secured by the anesthesia care team. The patient was then draped for standard maxillofacial procedure.  Intraoral periapical radiographs were obtained for a total of 4. An exam under anesthesia was completed with noted no carious lesions. There was a noted gingival abscess in the left anterior maxilla secondary to resorption of multiple retained deciduous teeth. This was also complicated by ectopic eruption of the adult premolar tooth as well as the left maxillary canine. Teeth #H, I were luxated and extracted with a dental forcep. The site was thoroughly curetted and irrigated. The tissues were reapproximated with interrupted 3-0 Chromic Gut suture. At this point there was no additional teeth  requiring extraction at this time.  The patient was then returned to the anesthesia care team where he was extubated without event. He was transported to the postanesthesia care unit for recovery and will be discharged home once meeting appropriate criteria.  [name], DMD Oral & Maxillofacial Surgery

## 2019-03-14 NOTE — Discharge Instructions (Signed)
Soft diet. Sutures will dissolve in 5-7 days.

## 2019-03-14 NOTE — Anesthesia Procedure Notes (Signed)
Procedure Name: Intubation Date/Time: 03/14/2019 2:17 PM Performed by: Griffin Dakin, CRNA Pre-anesthesia Checklist: Patient identified, Emergency Drugs available, Suction available and Patient being monitored Patient Re-evaluated:Patient Re-evaluated prior to induction Oxygen Delivery Method: Circle system utilized Preoxygenation: Pre-oxygenation with 100% oxygen Induction Type: Inhalational induction Ventilation: Mask ventilation without difficulty and Oral airway inserted - appropriate to patient size Laryngoscope Size: Glidescope and 3 Grade View: Grade II Tube type: Oral Tube size: 4.5 mm Number of attempts: 1 Airway Equipment and Method: Oral airway,  Video-laryngoscopy and Rigid stylet Placement Confirmation: ETT inserted through vocal cords under direct vision,  positive ETCO2 and breath sounds checked- equal and bilateral Secured at: 17 cm Tube secured with: Tape Dental Injury: Teeth and Oropharynx as per pre-operative assessment  Difficulty Due To: Difficulty was anticipated

## 2019-03-14 NOTE — Progress Notes (Signed)
  Echocardiogram 2D Echocardiogram has been performed.  Justin Watson 03/14/2019, 9:49 AM

## 2019-03-14 NOTE — Brief Op Note (Signed)
03/14/2019  3:52 PM  PATIENT:  Justin Watson  33 y.o. male  PRE-OPERATIVE DIAGNOSIS:  ABCESSED TEETH, CARIES  POST-OPERATIVE DIAGNOSIS:  ABCESSED TEETH, CARIES  PROCEDURE:  Procedure(s): Exam under anesthesia; Extraction of  two teeth; Radiograph (N/A)  SURGEON:  Surgeon(s) and Role:    * Welda Azzarello, DMD - Primary  ANESTHESIA:   general  EBL:  10 mL   BLOOD ADMINISTERED:none  DRAINS: none   LOCAL MEDICATIONS USED:  XYLOCAINE   SPECIMEN:  No Specimen  DISPOSITION OF SPECIMEN:  N/A  COUNTS:  YES  TOURNIQUET:  * No tourniquets in log *  DICTATION: .Dragon Dictation  PLAN OF CARE: Discharge to home after PACU  PATIENT DISPOSITION:  PACU - hemodynamically stable.   Delay start of Pharmacological VTE agent (>24hrs) due to surgical blood loss or risk of bleeding: not applicable

## 2019-03-14 NOTE — Transfer of Care (Signed)
Immediate Anesthesia Transfer of Care Note  Patient: Justin Watson  Procedure(s) Performed: Exam under anesthesia; Extraction of  two teeth; Radiograph (N/A )  Patient Location: PACU  Anesthesia Type:General  Level of Consciousness: drowsy  Airway & Oxygen Therapy: Patient Spontanous Breathing and Patient connected to T-piece oxygen  Post-op Assessment: Report given to RN and Post -op Vital signs reviewed and stable  Post vital signs: Reviewed and stable  Last Vitals:  Vitals Value Taken Time  BP 119/77 03/14/19 1543  Temp    Pulse 72 03/14/19 1548  Resp 28 03/14/19 1548  SpO2 100 % 03/14/19 1548  Vitals shown include unvalidated device data.  Last Pain:  Vitals:   03/14/19 0901  TempSrc: Axillary         Complications: No apparent anesthesia complications

## 2019-03-14 NOTE — Anesthesia Postprocedure Evaluation (Signed)
Anesthesia Post Note  Patient: Justin Watson  Procedure(s) Performed: Exam under anesthesia; Extraction of  two teeth; Radiograph (N/A )     Patient location during evaluation: PACU Anesthesia Type: General Level of consciousness: awake Pain management: pain level controlled Vital Signs Assessment: post-procedure vital signs reviewed and stable Respiratory status: spontaneous breathing, nonlabored ventilation, respiratory function stable and patient connected to nasal cannula oxygen Cardiovascular status: blood pressure returned to baseline and stable Postop Assessment: no apparent nausea or vomiting Anesthetic complications: no Comments: Patient at baseline mental status in PACU.    Last Vitals:  Vitals:   03/14/19 1557 03/14/19 1612  BP: 126/70 122/73  Pulse: 86 69  Resp: 16 10  Temp:    SpO2: 97% 98%    Last Pain:  Vitals:   03/14/19 1612  TempSrc:   PainSc: Asleep                 Justin Watson

## 2019-03-15 ENCOUNTER — Encounter (HOSPITAL_COMMUNITY): Payer: Self-pay | Admitting: Oral Surgery

## 2019-06-12 NOTE — Anesthesia Preprocedure Evaluation (Addendum)
Anesthesia Evaluation  Patient identified by MRN, date of birth, ID band Patient confused    Reviewed: Allergy & Precautions, NPO status , Patient's Chart, lab work & pertinent test results  Airway      Mouth opening: Pediatric Airway  Dental   Pulmonary    breath sounds clear to auscultation       Cardiovascular  Rhythm:Regular Rate:Normal     Neuro/Psych    GI/Hepatic   Endo/Other    Renal/GU      Musculoskeletal   Abdominal   Peds  Hematology   Anesthesia Other Findings   Reproductive/Obstetrics                             Anesthesia Physical Anesthesia Plan  ASA: III  Anesthesia Plan: General   Post-op Pain Management:    Induction:   PONV Risk Score and Plan: Ondansetron and Dexamethasone  Airway Management Planned: Oral ETT  Additional Equipment:   Intra-op Plan:   Post-operative Plan: Extubation in OR  Informed Consent: I have reviewed the patients History and Physical, chart, labs and discussed the procedure including the risks, benefits and alternatives for the proposed anesthesia with the patient or authorized representative who has indicated his/her understanding and acceptance.       Plan Discussed with: CRNA and Anesthesiologist  Anesthesia Plan Comments: (See PAT note written 06/12/2019 by Shonna Chock, PA-C.  History of Cornelia de Lange syndrome and Tetralogy of Fallot repair.  If preoperative labs or EKG felt indicated then anesthesiologist will need to notify Holding RN staff. He is s/p dental surgery 03/14/19. )       Anesthesia Quick Evaluation

## 2019-06-12 NOTE — Progress Notes (Signed)
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 He is s/p dental exam and extraction of two teeth under general anesthesia on 03/14/19 following comprehensive preoperative evaluation with primary care, in-depth conversation between mother Santiago Glad and anesthesiologist, and preoperative bedside limited echocardiogram.  There were no known anesthesia complications, and he was discharged home with paretns following brief stay in PACU.  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 He is non-verbal and has frequent arm movement/flapping and moves back and forth. He can hear, but mother doesn't think he is able to process to respond appropriately. He drinks liquids from a bottle and eats puree consistency foods. He spends most of his time in a recliner. He can crawl, but not really walk. As of 03/14/19, he was a DNR.  Anesthesia Records (regarding airway history): 03/14/19 [facility] Induction Type: Inhalational induction Ventilation: Mask ventilation without difficulty and Oral airway inserted - appropriate to patient size Laryngoscope Size: Glidescope and 3 Grade View: Grade II Tube type:  Oral Tube size: 4.5 mm Number of attempts: 1 Airway Equipment and Method: Oral airway,  Video-laryngoscopy and Rigid stylet Placement Confirmation: ETT inserted through vocal cords under direct vision,  positive ETCO2 and breath sounds checked- equal and bilateral Secured at: 17 cm Tube secured with: Tape Dental Injury: Teeth and Oropharynx as per pre-operative assessment  Difficulty Due To: Difficulty was anticipated   05/08/14 [facility] Pre O2/Mask induction: Preoxygenated with 100% O2 by facemask; Mask ventilation: Easy; Aize 5.5; ET tube Type: Oral; cuffed; Insertion attempts: 1; View: I; Blade: Mil 1.5; Type of view: Direct laryngoscopy; Placement assessment: Equal bilateral breath sounds, positive ETCO2; Airway assistance: Anterior laryngeal pressure; Intubation trauma: Atraumatic   06/18/11 [facility] Intubation Type:Combination inhalational/ intravenous induction Ventilation:Mask ventilation without difficulty and Oral airway inserted - appropriate to patient size Laryngoscope Size:Mac and 3 Grade View:Grade I Tube type:Oral Tube size:4.5 mm Number of attempts:4 Airway Equipment and Method: Stylet Dental Injury:Bloody posterior oropharynx  Difficulty Due EA:[id number] was anticipated Comments:DVL x 1 with MAC 2 for nasal intubation. Nasal ETT passed easily thru left nares with red rubber. 4.5 Nasal Raeon first attempt. Unable to pass thru cords. Switched to Glidescope to share view. Unable to pass ETT thru cords. Ett switched to 4.0 Nasal Dwyane Luo - unable to pass thru cords. Switched to 4.5 ETT with cuff. Passed thru cords by Dr. Tamala Julian on 4th attempt.     I have notified anesthesiologist Roberts Gaudy, MD of surgery plans. He is familiar with Keenan Bachelor, as he was the anesthesiologist for the 03/14/19 dental surgery. Rama will not require another PAT visit, but will be assessed on the day of surgery.  Presurgical COVID-19 testing is currently scheduled for  06/21/2019.   VS:  As of  03/14/19: WT 20.4 kg, BP 122/73, HR 69   PROVIDERS: Sueanne Margarita, DO is PCP at [facility] first established in 2020. Previously, he was seeing pediatrician Harrie Jeans, MD until health insurance change.    - He is not currently followed by cardiology, although Adrian Prows, MD with [facility] did perform a bedside echocardiogram prior to 03/14/19 dental surgery. Previously he was seeing pediatric cardiologist Darrol Jump, MD with [facility] in [place] [phone]). Last visit 07/01/11. 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 He talked about long term prognosis and "probable progression of his right heart failure and signs of either bronchiole or airway compression due to massive right ventricular enlargement or progressive right heart failure due to volume overload with diminution of function" He discussed edema, GI intolerance and hepatomegaly as all possible signs, as well as arrhythmias related to RVH, including potential for fatal arrhythmia, and also risk for clotting or PE due to poorly functioning dilator in RV. He discussed the only way to assess Blakes' CV function was with echocardiogram or MRI under anesthesia, but parents did not want to pursue additional cardiac intervention, so no further testing ordered at that time. He discussed potential for 2-3 year follow-up, but would leave it up to them since not plans for further intervention at that point.    LABS: If desired by assigned anesthesiologist then would need to obtain on the day of surgery. He has required lab draws under sedation or anesthesia in the past.     IMAGES: Last CXR seen on 11/25/11 [facility]): FINDINGS: There is mild lumbar scoliosis convex to the left. The cardiac  silhouette shows enlargement with mild pulmonary congestion. The pulmonary  artery segments are markedly  enlarged suggesting pulmonary arterial  hypertension. Surgical clips are seen in the anterior mediastinum. Petra Kuba of  the prior surgery is unknown.   EKG: Mother said EKG was done at [facility], but medical records could not locate an EKG  at their office, so if anesthesiologist desires an EKG then this will need to be done on the day of surgery.   EKG dated 09/05/87 (Media tab, Correspondence) showed: ST at 107 bpm, left atrial enlargement, right BBB, T wave abnormality, consider lateral ischemia.    CV: Echo (limited) 03/14/19 Adrian Prows, MD): IMPRESSIONS  1. Left ventricular ejection fraction, by visual estimation, is 55 to  60%. The left ventricle has normal function. There is no left ventricular  hypertrophy.  2. Left ventricular diastolic function could not be evaluated.  3. Right ventricular volume and pressure overload.  4. Global right ventricle has normal systolic function.The right  ventricular size is mildly enlarged. No increase in right ventricular wall  thickness.  5. Left atrial size was normal.  6. Right atrial size was normal.  7. The mitral valve was not well visualized. Trace mitral valve  regurgitation.  8. The tricuspid valve is grossly normal. Tricuspid valve regurgitation  is not demonstrated.  9. The aortic valve was not well visualized. Aortic valve regurgitation  is not visualized. Mild to moderate aortic valve sclerosis/calcification  without any evidence of aortic stenosis.  10. The pulmonic valve was not well visualized. Pulmonic valve  regurgitation is not visualized.  11. TR signal is inadequate for assessing pulmonary artery systolic  pressure.  12. Limited echo done to evaluate for general anesthesia due to h.o  TOF  repair about 25 years ago. Grossly the RV and LV function is normal and  preserved. Poor echo and off axis views. Proceed with surgery for tooth  extraction, needs endocarditis  prophylaxis. Surgery  done for compassionate reasons.  13. The interatrial septum was not well visualized.  (Comparison 08/20/03 [facility] Care Everywhere: Dilated RA; atrial septum with PFO, predominantly left to right shunt; mild TR; severe RV dilatation with normal wall thickness and grossly normal function; RVSP is at least 35 mmHg plus the PA pressure; Left heart chambers are mildly compressed with normal LV wall thickness and mild to moderately decreased LV function; Conotruncal anomalies: Tetralogy of Fallot s/p repair; no residual VSD; free pulmonary valve insufficiency; the right pulmonary artery appears normal in size and the left pulmonary artery origin appears moderately stenotic with peak velocity 2 m/s into each branch pulmonary artery; Proximal great arteries: Pulmonary artery trunk dilatation)   Past Medical History:  Diagnosis Date  . Blood transfusion    mom not sure when however   . Cornelia de Lange syndrome   . Difficult intubation    SMALL  AIRWAY  . Family history of adverse reaction to anesthesia    Father had post-op nausea  . GERD (gastroesophageal reflux disease)   . Heart murmur   . PFO (patent foramen ovale)    predominantly left to right shunt by '05 echo  . Recurrent upper respiratory infection (URI)    sinus   . Tetralogy of Fallot s/p repair    1991  [facility]    Past Surgical History:  Procedure Laterality Date  . APPENDECTOMY    . DENTAL RESTORATION/EXTRACTION WITH X-RAY N/A 03/14/2019   Procedure: Exam under anesthesia; Extraction of  two teeth; Radiograph;  Surgeon: Michael Litter, DMD;  Location: [facility];  Service: Oral Surgery;  Laterality: N/A;  . DUODENOAL BY PASS     2001  . EYE EXAMINATION UNDER ANESTHESIA  06/18/2011   Procedure: EYE EXAM UNDER ANESTHESIA;  Surgeon: Hurman Horn, MD;  Location: [place];  Service: Ophthalmology;  Laterality: Bilateral;  . EYE SURGERY    . GASTROSTOMY TUBE PLACEMENT     1989  . HERNIA REPAIR     BILATERAL  INGUINAL  . JOINT  REPLACEMENT     BIL  HIP   FOR   DISLOCATION  92 & 94  . MECKEL DIVERTICULUM EXCISION    . TETRALOGY OF FALLOT REPAIR     1991  . TOOTH EXTRACTION  06/18/2011   Procedure: DENTAL RESTORATION/EXTRACTIONS;  Surgeon: Antonietta Breach, DDS;  Location: [facility];  Service: Oral Surgery;  Laterality: N/A;  Oral examination. Four fillings. Two extractions. Oral prophylaxis cleaning. Five x-rays.    MEDICATIONS: No current facility-administered medications for this encounter.   Marland Kitchen alum & mag hydroxide-simeth (ANTACID) 200-200-20 MG/5ML suspension  . amoxicillin (AMOXIL) 250 MG/5ML suspension  . bethanechol (URECHOLINE) 1 mg/mL SUSP  . chlorhexidine (PERIDEX) 0.12 % solution  . esomeprazole (NEXIUM) 40 MG capsule  . ibuprofen (ADVIL) 100 MG/5ML suspension  . lactulose (CHRONULAC) 10 GM/15ML solution  . loratadine (CLARITIN) 5 MG/5ML syrup  Amoxicillin and Peridex were prescribed with 03/14/19 dental surgery.    Myra Gianotti, PA-C Surgical Short Stay/Anesthesiology [facility] Phone [phone] [facility] Phone [phone] 06/12/2019 5:25 PM

## 2019-06-21 ENCOUNTER — Other Ambulatory Visit (HOSPITAL_COMMUNITY)
Admission: RE | Admit: 2019-06-21 | Discharge: 2019-06-21 | Disposition: A | Discharge status: Home / Self Care | Payer: Medicare Other | Source: Ambulatory Visit | Attending: Otolaryngology | Admitting: Otolaryngology

## 2019-06-21 ENCOUNTER — Other Ambulatory Visit: Payer: Self-pay

## 2019-06-21 DIAGNOSIS — Z20822 Contact with and (suspected) exposure to covid-19: Secondary | ICD-10-CM | POA: Diagnosis present

## 2019-06-21 LAB — SARS CORONAVIRUS 2 (TAT 6-24 HRS): SARS Coronavirus 2: NEGATIVE

## 2019-06-21 NOTE — Progress Notes (Signed)
Spoke with pt's mother, Clydie Braun for pre-op call. 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 Mom states nothing has changed with pt's medical and surgical history since pt had surgery here December, 2020.   Covid test done today. Mom states pt is in quarantine and understands that he stays in it until they come to the hospital on Monday. Mom states pt will get his 1st Covid vaccine on this Saturday, 06/23/19.

## 2019-06-21 NOTE — H&P (Signed)
Otolaryngology Clinic Note  HPI:    Justin Watson is a 34 y.o. male patient of Rosanne Sack, DO for evaluation of right anterior epistaxis.  5-1/2-year return visit.  He has had chronic nasal bleeding right greater than left off and on for years.  He had recent outpatient dental surgery which he tolerated well.  Anesthesia was uneventful.  He received a course of antibiotics after the dental surgery which seemed to improve his nose temporarily.  Mother uses nasal hygiene measures infrequently.  No pain.  No blood thinners.  He is not bleeding anywhere else. PMH/Meds/All/SocHx/FamHx/ROS:   Past Medical History      Past Medical History:  Diagnosis Date  . Acid reflux       Past Surgical History  History reviewed. No pertinent surgical history.    No family history of bleeding disorders, wound healing problems or difficulty with anesthesia.   Social History  Social History        Socioeconomic History  . Marital status: Single    Spouse name: Not on file  . Number of children: Not on file  . Years of education: Not on file  . Highest education level: Not on file  Occupational History  . Not on file  Social Needs  . Financial resource strain: Not on file  . Food insecurity    Worry: Not on file    Inability: Not on file  . Transportation needs    Medical: Not on file    Non-medical: Not on file  Tobacco Use  . Smoking status: Never Smoker  . Smokeless tobacco: Never Used  Substance and Sexual Activity  . Alcohol use: Not on file  . Drug use: Not on file  . Sexual activity: Not on file  Lifestyle  . Physical activity    Days per week: Not on file    Minutes per session: Not on file  . Stress: Not on file  Relationships  . Social Musician on phone: Not on file    Gets together: Not on file    Attends religious service: Not on file    Active member of club or organization: Not on file    Attends meetings of clubs  or organizations: Not on file    Relationship status: Not on file  Other Topics Concern  . Not on file  Social History Narrative  . Not on file       Current Outpatient Medications:  .  aluminum hydroxide-magnesium carbonate (GAVISCON) 95-358 mg/15 mL suspension, Take 15 mLs by mouth., Disp: , Rfl:  .  bethanechol (URECHOLINE) 50 MG tablet,          , Disp: , Rfl:  .  esomeprazole (NEXIUM) 40 MG capsule, Take 40 mg by mouth., Disp: , Rfl:  .  lactulose (CEPHULAC) 10 gram/15 mL solution, Take by mouth., Disp: , Rfl:  .  loratadine (CLARITIN) 5 mg/5 mL syrup, Take 5 mg by mouth., Disp: , Rfl:   A complete ROS was performed with pertinent positives/negatives noted in the HPI. The remainder of the ROS are negative.    Physical Exam:    Temp 97.1 F (36.2 C)   Ht 1.397 m (4\' 7" ) Comment: mother gave height  Wt (!) 20.4 kg (45 lb) Comment: mother gave weight  BMI 10.46 kg/m  He remains very small.  Both ears are occluded with wax.  Anterior nose is heavily crusted on the right side.  I did not see  a granuloma or obvious exposed vessel.  I could not examine the oral cavity.  Neck without adenopathy.      Impression & Plans:   Chronic right anterior epistaxis.  Plan: I gave them nasal hygiene and nosebleed instructions once again.  I would like him to place a small amount of Bactroban ointment in each nostril twice daily.  I will check him back in 1 month.  If we are not achieving control of the nosebleeds, it may be reasonable to put him to sleep to cauterize what ever seems active in the nose, and also to clean his ears.   Lilyan Gilford, MD  [phone]

## 2019-06-23 ENCOUNTER — Other Ambulatory Visit: Payer: Self-pay

## 2019-06-23 ENCOUNTER — Ambulatory Visit: Payer: Medicare Other | Attending: Internal Medicine

## 2019-06-23 DIAGNOSIS — Z23 Encounter for immunization: Secondary | ICD-10-CM

## 2019-06-23 NOTE — Progress Notes (Signed)
   Covid-19 Vaccination Clinic  Name:  Justin Watson    MRN: [id number] DOB: 26-Dec-1985  06/23/2019  [name] was observed post Covid-19 immunization for 15 minutes without incident. He was provided with Vaccine Information Sheet and instruction to access the V-Safe system.   [name] was instructed to call 911 with any severe reactions post vaccine: Marland Kitchen Difficulty breathing  . Swelling of face and throat  . A fast heartbeat  . A bad rash all over body  . Dizziness and weakness   Immunizations Administered    Name Date Dose VIS Date Route   Pfizer COVID-19 Vaccine 06/23/2019 12:51 PM 0.3 mL 03/23/2019 Intramuscular   Manufacturer: [employer], [employer]   Lot: [id number]   NDC: [address]-0

## 2019-06-25 ENCOUNTER — Ambulatory Visit (HOSPITAL_COMMUNITY): Payer: Medicare Other | Admitting: Vascular Surgery

## 2019-06-25 ENCOUNTER — Encounter (HOSPITAL_COMMUNITY)
Admission: RE | Disposition: A | Discharge status: Home / Self Care | Payer: Self-pay | Source: Home / Self Care | Attending: Otolaryngology

## 2019-06-25 ENCOUNTER — Ambulatory Visit (HOSPITAL_COMMUNITY)
Admission: RE | Admit: 2019-06-25 | Discharge: 2019-06-25 | Disposition: A | Discharge status: Home / Self Care | Payer: Medicare Other | Attending: Otolaryngology | Admitting: Otolaryngology

## 2019-06-25 ENCOUNTER — Encounter (HOSPITAL_COMMUNITY): Payer: Self-pay | Admitting: Otolaryngology

## 2019-06-25 DIAGNOSIS — Z79899 Other long term (current) drug therapy: Secondary | ICD-10-CM | POA: Insufficient documentation

## 2019-06-25 DIAGNOSIS — H6123 Impacted cerumen, bilateral: Secondary | ICD-10-CM | POA: Insufficient documentation

## 2019-06-25 DIAGNOSIS — Z8774 Personal history of (corrected) congenital malformations of heart and circulatory system: Secondary | ICD-10-CM | POA: Diagnosis not present

## 2019-06-25 DIAGNOSIS — R040 Epistaxis: Secondary | ICD-10-CM | POA: Diagnosis not present

## 2019-06-25 DIAGNOSIS — K219 Gastro-esophageal reflux disease without esophagitis: Secondary | ICD-10-CM | POA: Insufficient documentation

## 2019-06-25 HISTORY — PX: NASAL HEMORRHAGE CONTROL: SHX287

## 2019-06-25 HISTORY — PX: CERUMEN REMOVAL: SHX6571

## 2019-06-25 SURGERY — CONTROL OF EPISTAXIS
Anesthesia: General | Site: Nose | Laterality: Bilateral

## 2019-06-25 MED ORDER — OXYMETAZOLINE HCL 0.05 % NA SOLN
2.0000 | NASAL | Status: DC | PRN
  Administered 2019-06-25: 2 via NASAL

## 2019-06-25 MED ORDER — CIPROFLOXACIN-DEXAMETHASONE 0.3-0.1 % OT SUSP
OTIC | Status: AC
  Filled 2019-06-25: qty 7.5

## 2019-06-25 MED ORDER — SODIUM CHLORIDE (PF) 0.9 % IJ SOLN
INTRAMUSCULAR | Status: AC
  Filled 2019-06-25: qty 10

## 2019-06-25 MED ORDER — OXYMETAZOLINE HCL 0.05 % NA SOLN
NASAL | Status: AC
  Filled 2019-06-25: qty 30

## 2019-06-25 MED ORDER — FENTANYL CITRATE (PF) 250 MCG/5ML IJ SOLN
INTRAMUSCULAR | Status: AC
  Filled 2019-06-25: qty 5

## 2019-06-25 MED ORDER — DEXAMETHASONE SODIUM PHOSPHATE 10 MG/ML IJ SOLN
INTRAMUSCULAR | Status: DC | PRN
  Administered 2019-06-25: 4 mg via INTRAVENOUS

## 2019-06-25 MED ORDER — PHENYLEPHRINE 40 MCG/ML (10ML) SYRINGE FOR IV PUSH (FOR BLOOD PRESSURE SUPPORT)
PREFILLED_SYRINGE | INTRAVENOUS | Status: AC
  Filled 2019-06-25: qty 10

## 2019-06-25 MED ORDER — MIDAZOLAM HCL 2 MG/ML PO SYRP
ORAL_SOLUTION | ORAL | Status: AC
  Administered 2019-06-25: 8 mg via ORAL
  Filled 2019-06-25: qty 4

## 2019-06-25 MED ORDER — OXYMETAZOLINE HCL 0.05 % NA SOLN
NASAL | Status: DC | PRN
  Administered 2019-06-25: 1

## 2019-06-25 MED ORDER — LIDOCAINE-EPINEPHRINE 1 %-1:100000 IJ SOLN
INTRAMUSCULAR | Status: AC
  Filled 2019-06-25: qty 1

## 2019-06-25 MED ORDER — ONDANSETRON HCL 4 MG/2ML IJ SOLN
INTRAMUSCULAR | Status: AC
  Filled 2019-06-25: qty 2

## 2019-06-25 MED ORDER — SODIUM CHLORIDE 0.9 % IV SOLN: INTRAVENOUS | Status: DC | PRN

## 2019-06-25 MED ORDER — BACITRACIN ZINC 500 UNIT/GM EX OINT
TOPICAL_OINTMENT | CUTANEOUS | Status: AC
  Filled 2019-06-25: qty 28.35

## 2019-06-25 MED ORDER — MIDAZOLAM HCL 2 MG/ML PO SYRP: 8.0000 mg | ORAL_SOLUTION | Freq: Once | ORAL | Status: AC

## 2019-06-25 MED ORDER — ONDANSETRON HCL 4 MG/2ML IJ SOLN
INTRAMUSCULAR | Status: DC | PRN
  Administered 2019-06-25: 2 mg via INTRAVENOUS

## 2019-06-25 MED ORDER — PROPOFOL 10 MG/ML IV BOLUS
INTRAVENOUS | Status: AC
  Filled 2019-06-25: qty 20

## 2019-06-25 SURGICAL SUPPLY — 34 items
BLADE SURG 15 STRL LF DISP TIS (BLADE) IMPLANT
BLADE SURG 15 STRL SS (BLADE)
CANISTER SUCT 3000ML PPV (MISCELLANEOUS) ×3 IMPLANT
COAGULATOR SUCT SWTCH 10FR 6 (ELECTROSURGICAL) ×3 IMPLANT
COVER BACK TABLE 60X90IN (DRAPES) ×3 IMPLANT
COVER MAYO STAND STRL (DRAPES) ×3 IMPLANT
COVER SURGICAL LIGHT HANDLE (MISCELLANEOUS) ×1 IMPLANT
COVER WAND RF STERILE (DRAPES) ×3 IMPLANT
DECANTER SPIKE VIAL GLASS SM (MISCELLANEOUS) ×3 IMPLANT
DRAPE HALF SHEET 40X57 (DRAPES) ×3 IMPLANT
ELECT REM PT RETURN 9FT ADLT (ELECTROSURGICAL) ×3
ELECTRODE REM PT RTRN 9FT ADLT (ELECTROSURGICAL) ×2 IMPLANT
GAUZE 4X4 16PLY RFD (DISPOSABLE) ×3 IMPLANT
GAUZE PACKING FOLDED 2  STR (GAUZE/BANDAGES/DRESSINGS) ×3
GAUZE PACKING FOLDED 2 STR (GAUZE/BANDAGES/DRESSINGS) ×2 IMPLANT
GAUZE SPONGE 2X2 8PLY STRL LF (GAUZE/BANDAGES/DRESSINGS) ×2 IMPLANT
GAUZE VASELINE FOILPK 1/2 X 72 (GAUZE/BANDAGES/DRESSINGS) IMPLANT
GLOVE SS BIOGEL STRL SZ 7.5 (GLOVE) ×2 IMPLANT
GLOVE SUPERSENSE BIOGEL SZ 7.5 (GLOVE) ×1
GOWN STRL REUS W/ TWL LRG LVL3 (GOWN DISPOSABLE) ×4 IMPLANT
GOWN STRL REUS W/TWL LRG LVL3 (GOWN DISPOSABLE) ×6
KIT BASIN OR (CUSTOM PROCEDURE TRAY) ×3 IMPLANT
KIT TURNOVER KIT B (KITS) ×3 IMPLANT
NDL HYPO 25GX1X1/2 BEV (NEEDLE) IMPLANT
NEEDLE HYPO 25GX1X1/2 BEV (NEEDLE) IMPLANT
NS IRRIG 1000ML POUR BTL (IV SOLUTION) ×3 IMPLANT
PAD ARMBOARD 7.5X6 YLW CONV (MISCELLANEOUS) ×6 IMPLANT
PATTIES SURGICAL .5 X3 (DISPOSABLE) ×3 IMPLANT
POSITIONER HEAD DONUT 9IN (MISCELLANEOUS) ×1 IMPLANT
SPONGE GAUZE 2X2 STER 10/PKG (GAUZE/BANDAGES/DRESSINGS)
SYR CONTROL 10ML LL (SYRINGE) ×1 IMPLANT
TOWEL GREEN STERILE FF (TOWEL DISPOSABLE) ×6 IMPLANT
TUBE CONNECTING 12X1/4 (SUCTIONS) ×3 IMPLANT
WATER STERILE IRR 1000ML POUR (IV SOLUTION) ×3 IMPLANT

## 2019-06-25 NOTE — Progress Notes (Signed)
Unable to obtain BP d/t patient behavior.

## 2019-06-25 NOTE — Transfer of Care (Signed)
Immediate Anesthesia Transfer of Care Note  Patient: Justin Watson  Procedure(s) Performed: EPISTAXIS CONTROL (Bilateral Nose) CERUMEN REMOVAL (Bilateral Ear)  Patient Location: PACU  Anesthesia Type:General  Level of Consciousness: awake  Airway & Oxygen Therapy: Patient Spontanous Breathing and Patient connected to face mask oxygen  Post-op Assessment: Report given to RN and Post -op Vital signs reviewed and stable  Post vital signs: Reviewed and stable  Last Vitals:  Vitals Value Taken Time  BP 103/86 06/25/19 1004  Temp 36.1 C 06/25/19 1004  Pulse 89 06/25/19 1013  Resp 23 06/25/19 1009  SpO2 81 % 06/25/19 1013  Vitals shown include unvalidated device data.  Last Pain:  Vitals:   06/25/19 0814  TempSrc: Axillary         Complications: No apparent anesthesia complications

## 2019-06-25 NOTE — Op Note (Signed)
06/25/2019  10:09 AM    Anselmo Pickler  [id number]   Pre-Op Dx: Bilateral cerumen impactions.  Anterior epistaxis bilateral  Post-op Dx: Same  Proc: Bilateral ear canal cleaning.  Anterior cautery epistaxis bilateral  Surg:  Flo Shanks T MD  Anes:  GMask  EBL:  none  Comp:  none  Findings: Obstructing wax in each ear canal.  Slightly retracted tympanic membranes without signs of fluid or infection.  Drying and excoriation of the anterior septum on both sides.  Erosion of the right nasal sill at the 8 o'clock position.  Procedure: With the patient in a comfortable supine position, general mask anesthesia was administered.  At an appropriate level, microscope and speculum were used to examine and clean each ear canal.  Findings were as described above.  Afrin spray was applied on a cotton pledgets of the anterior nose on each side.  Several minutes were allowed for this to take effect.  The pledgets were removed and some crusting was removed.  Small vessels were cauterized on the anterior septum.  Bacitracin ointment was applied.  Dispo:   PACU to home  Plan: Nasal hygiene measures.  Wax hygiene measures.  Recheck my office 2 weeks.  Cephus Richer MD

## 2019-06-25 NOTE — Anesthesia Postprocedure Evaluation (Signed)
Anesthesia Post Note  Patient: Justin Watson  Procedure(s) Performed: EPISTAXIS CONTROL (Bilateral Nose) CERUMEN REMOVAL (Bilateral Ear)     Patient location during evaluation: PACU Anesthesia Type: General Level of consciousness: awake Pain management: pain level controlled Vital Signs Assessment: post-procedure vital signs reviewed and stable Respiratory status: spontaneous breathing, nonlabored ventilation and respiratory function stable Cardiovascular status: blood pressure returned to baseline and stable Postop Assessment: no apparent nausea or vomiting Anesthetic complications: no    Last Vitals:  Vitals:   06/25/19 1004 06/25/19 1014  BP: 103/86   Pulse: (!) 110   Resp: 14   Temp: (!) 36.1 C   SpO2: 94% 94%    Last Pain:  Vitals:   06/25/19 0814  TempSrc: Axillary                 Elecia Serafin COKER

## 2019-06-25 NOTE — Interval H&P Note (Signed)
History and Physical Interval Note:  06/25/2019 9:19 AM  Justin Watson  has presented today for surgery, with the diagnosis of BILATERAL CERUMEN REMOVAL/EPISTAXIS.  The various methods of treatment have been discussed with the patient and family. After consideration of risks, benefits and other options for treatment, the patient has consented to  Procedure(s): EPISTAXIS CONTROL (Bilateral) CERUMEN REMOVAL (Bilateral) as a surgical intervention.  The patient's history has been re-reviewed, patient re-examined, no change in status, stable for surgery.  I have re-reviewed the patient's chart and labs.  Questions were answered to the patient's satisfaction.     Zola Button [facility]

## 2019-06-25 NOTE — Anesthesia Procedure Notes (Signed)
Procedure Name: LMA Insertion Date/Time: 06/25/2019 9:38 AM Performed by: Epifanio Lesches, CRNA Pre-anesthesia Checklist: Patient identified, Emergency Drugs available, Suction available, Patient being monitored and Timeout performed Patient Re-evaluated:Patient Re-evaluated prior to induction Oxygen Delivery Method: Circle system utilized Preoxygenation: Pre-oxygenation with 100% oxygen Induction Type: Inhalational induction Ventilation: Mask ventilation without difficulty LMA: LMA inserted LMA Size: 2.5 Number of attempts: 1 Placement Confirmation: positive ETCO2 and breath sounds checked- equal and bilateral Tube secured with: Tape Dental Injury: Teeth and Oropharynx as per pre-operative assessment

## 2019-06-25 NOTE — Discharge Instructions (Signed)
One drop of oil each ear canal once a week to keep the wax soft Use nasal hygiene measures.    Recheck my office 2 weeks, [phone]

## 2019-07-18 ENCOUNTER — Ambulatory Visit: Payer: Medicare Other | Attending: Internal Medicine

## 2019-07-18 DIAGNOSIS — Z23 Encounter for immunization: Secondary | ICD-10-CM

## 2019-07-18 NOTE — Progress Notes (Signed)
   Covid-19 Vaccination Clinic  Name:  ASIER DESROCHES    MRN: [id number] DOB: 22-Sep-1985  07/18/2019  Mr. Gonzaga was observed post Covid-19 immunization for 15 minutes without incident. He was provided with Vaccine Information Sheet and instruction to access the V-Safe system.   Mr. Cheek was instructed to call 911 with any severe reactions post vaccine: Marland Kitchen Difficulty breathing  . Swelling of face and throat  . A fast heartbeat  . A bad rash all over body  . Dizziness and weakness   Immunizations Administered    Name Date Dose VIS Date Route   Pfizer COVID-19 Vaccine 07/18/2019 11:39 AM 0.3 mL 03/23/2019 Intramuscular   Manufacturer: [employer], [employer]   Lot: [id number]   NDC: [address]-5
